# Patient Record
Sex: Male | Born: 1985 | Race: Black or African American | Hispanic: No | Marital: Single | Smoking: Current some day smoker
Health system: Southern US, Community
[De-identification: ages and names within clinical notes are randomized; demographics above are authoritative.]

## PROBLEM LIST (undated history)

## (undated) DIAGNOSIS — F329 Major depressive disorder, single episode, unspecified: Secondary | ICD-10-CM

## (undated) DIAGNOSIS — F419 Anxiety disorder, unspecified: Secondary | ICD-10-CM

## (undated) DIAGNOSIS — F32A Depression, unspecified: Secondary | ICD-10-CM

## (undated) DIAGNOSIS — I509 Heart failure, unspecified: Secondary | ICD-10-CM

---
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
## 2019-10-16 ENCOUNTER — Other Ambulatory Visit: Payer: Self-pay

## 2019-10-16 ENCOUNTER — Ambulatory Visit (HOSPITAL_COMMUNITY)
Admission: EM | Admit: 2019-10-16 | Discharge: 2019-10-16 | Disposition: A | Discharge status: Home / Self Care | Payer: Self-pay

## 2019-10-16 ENCOUNTER — Encounter (HOSPITAL_COMMUNITY): Payer: Self-pay

## 2019-10-16 DIAGNOSIS — T148XXA Other injury of unspecified body region, initial encounter: Secondary | ICD-10-CM

## 2019-10-16 HISTORY — DX: Depression, unspecified: F32.A

## 2019-10-16 HISTORY — DX: Anxiety disorder, unspecified: F41.9

## 2019-10-16 NOTE — ED Triage Notes (Signed)
Pt states he got glass in his right upper leg a few years ago, but they never got all of the glass out, because it was too deep and for the past 3 days he's been having pain in the leg. Pt c/o 8/10 throbbing right upper leg pain. Pt states he vomited once today. Pt denies diarrhea. Pt has some swelling in a small area of the right outer thigh.

## 2019-10-16 NOTE — Discharge Instructions (Signed)
Keep doing the compression with ace wrap to the upper leg Rest, ice and elevate.  Follow up as needed for continued or worsening symptoms

## 2019-10-17 NOTE — ED Provider Notes (Signed)
[facility]    CSN: [id number] Arrival date & time: 10/16/19  1446      History   Chief Complaint Chief Complaint  Patient presents with  . right leg pain    HPI Tyrone White is a 34 y.o. male.   Patient is a 34 year old male that presents today with right posterior thigh pain.  This has been constant over the past 3 days.  Pain with walking and range of motion.  History of previous injury to that leg with glass involvement.  Reporting still has piece of glass in the back of his leg.  Denies any redness or drainage from the areas.  Describes the sensation as pulling in the back of his leg.  Denies any specific injuries.  Mild swelling to the back of the leg.  No numbness, tingling, weakness or radiation of pain.  ROS per HPI      Past Medical History:  Diagnosis Date  . Anxiety   . Depression     There are no problems to display for this patient.   History reviewed. No pertinent surgical history.     Home Medications    Prior to Admission medications   Not on File    Family History History reviewed. No pertinent family history.  Social History Social History   Tobacco Use  . Smoking status: Former Research scientist (life sciences)  . Smokeless tobacco: Never Used  Substance Use Topics  . Alcohol use: Never  . Drug use: Never     Allergies   Patient has no known allergies.   Review of Systems Review of Systems   Physical Exam Triage Vital Signs ED Triage Vitals  Enc Vitals Group     BP 10/16/19 1503 (!) 147/88     Pulse Rate 10/16/19 1503 92     Resp 10/16/19 1503 16     Temp 10/16/19 1503 98.7 F (37.1 C)     Temp Source 10/16/19 1503 Oral     SpO2 10/16/19 1503 99 %     Weight 10/16/19 1508 160 lb (72.6 kg)     Height 10/16/19 1508 6' (1.829 m)     Head Circumference --      Peak Flow --      Pain Score 10/16/19 1508 8     Pain Loc --      Pain Edu? --      Excl. in [place]? --    No data found.  Updated Vital Signs BP (!) 147/88 (BP  Location: Left Arm)   Pulse 92   Temp 98.7 F (37.1 C) (Oral)   Resp 16   Ht 6' (1.829 m)   Wt 160 lb (72.6 kg)   SpO2 99%   BMI 21.70 kg/m   Visual Acuity Right Eye Distance:   Left Eye Distance:   Bilateral Distance:    Right Eye Near:   Left Eye Near:    Bilateral Near:     Physical Exam Vitals and nursing note reviewed.  Constitutional:      Appearance: Normal appearance.  HENT:     Head: Normocephalic and atraumatic.     Nose: Nose normal.  Eyes:     Conjunctiva/sclera: Conjunctivae normal.  Pulmonary:     Effort: Pulmonary effort is normal.  Musculoskeletal:        General: Normal range of motion.     Cervical back: Normal range of motion.       Legs:     Comments: TTP with mild swelling.  Some indention and scarring from previous injury. No bruising.  Normal ROM   Skin:    General: Skin is warm and dry.  Neurological:     Mental Status: He is alert.  Psychiatric:        Mood and Affect: Mood normal.      UC Treatments / Results  Labs (all labs ordered are listed, but only abnormal results are displayed) Labs Reviewed - No data to display  EKG   Radiology No results found.  Procedures Procedures (including critical care time)  Medications Ordered in UC Medications - No data to display  Initial Impression / Assessment and Plan / UC Course  I have reviewed the triage vital signs and the nursing notes.  Pertinent labs & imaging results that were available during my care of the patient were reviewed by me and considered in my medical decision making (see chart for details).     Muscle pain- most likely hamstring strain.  Treating with ace wrap, ice.  NSAIDs Follow up as needed for continued or worsening symptoms  Final Clinical Impressions(s) / UC Diagnoses   Final diagnoses:  Muscle strain     Discharge Instructions     Keep doing the compression with ace wrap to the upper leg Rest, ice and elevate.  Follow up as needed for  continued or worsening symptoms     ED Prescriptions    None     PDMP not reviewed this encounter.   Janace Aris, NP 10/17/19 [phone]

## 2020-01-12 ENCOUNTER — Other Ambulatory Visit: Payer: Self-pay

## 2020-01-12 ENCOUNTER — Ambulatory Visit (INDEPENDENT_AMBULATORY_CARE_PROVIDER_SITE_OTHER): Payer: No Payment, Other | Admitting: Licensed Clinical Social Worker

## 2020-01-12 DIAGNOSIS — F411 Generalized anxiety disorder: Secondary | ICD-10-CM | POA: Diagnosis not present

## 2020-01-12 NOTE — Progress Notes (Signed)
Comprehensive Clinical Assessment (CCA) Note  01/12/2020 Tyrone White [id number]  Visit Diagnosis:      ICD-10-CM   1. Generalized anxiety disorder  F41.1     CCA Screening, Triage and Referral (STR) Patient Reported Information How did you hear about [place]? Other (Comment) ([facility], Tyrone White)  What Is the Reason for Your Visit/Call Today? Coping with anx and additction  How Long Has This Been Causing You Problems? > than 6 months  What Do You Feel Would Help You the Most Today? Therapy;Medication  Have You Recently Been in Any Inpatient Treatment (Hospital/Detox/Crisis Center/28-Day Program)? No  Have You Ever Received Services From [facility] Before? Yes  Who Do You See at [facility]? Urgent Care  Have You Recently Had Any Thoughts About Hurting Yourself? No  Are You Planning to Commit Suicide/Harm Yourself At This time? No  Have you Recently Had Thoughts About Hurting Someone Tyrone White? No  Have You Used Any Alcohol or Drugs in the Past 24 Hours? No  How Long Ago Did You Use Drugs or Alcohol? No data recorded What Did You Use and How Much? No data recorded  Do You Currently Have a Therapist/Psychiatrist? No  Have You Been Recently Discharged From Any Office Practice or Programs? No   CCA Screening Triage Referral Assessment Type of Contact: Face-to-Face  Is this Initial or Reassessment? Initial Assessment  Collateral Involvement: Tyrone White  Patient Determined To Be At Risk for Harm To Self or Others Based on Review of Patient Reported Information or Presenting Complaint? No  Location of Assessment: [facility]  Does Patient Present under Involuntary Commitment? No  [place] of Residence: [place]  Patient Currently Receiving the Following Services: Medication Management;Group Home  Options For Referral: Outpatient Therapy;Medication Management  CCA Biopsychosocial Intake/Chief Complaint:  CCA Intake With Chief Complaint CCA Part Two  Date: 01/12/20 CCA Part Two Time: 0200 Chief Complaint/Presenting Problem: Problem with anxiety and adequate coping without use of substances. Patient's Currently Reported Symptoms/Problems: mparanoia, always feeling like people are talking about him, poor focus, poor sleep, poor self-esteem Individual's Strengths: Receptive to help, help seeking Individual's Preferences: Wants to be called "Tyrone White". Type of Services Patient Feels Are Needed: Couseling, has appt for med management Initial Clinical Notes/Concerns: LCSW reviewed informed consent with pt verbal statement of understanding. Pt reports he is living at [facility] where he is not totally comforable but plans to remain for now. He was homeless ~ 2 mon. prior to that and living with an Aunt prior to that.  He feels he is disliked at [facility] and people are talking about him. Reports hearing voices at times that sound like people talking about him. 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 He denies any intent for self harm and states he does not hear any command voices. Believes he first heard voices at ~ age of 89. Pt lost his mother in 2016 to heart disease. He does not feel he got the support he needed to process loss at that time. Pt would like help developing coping skills to address feelings of anxiety and paranoia so he does not use substances to cope. Pt considering applying for disability.  Mental Health Symptoms Depression:  Depression: None  Mania:  Mania: None  Anxiety:   Anxiety: Difficulty concentrating, Irritability, Restlessness, Worrying, Tension, Sleep  Psychosis:  Psychosis: Duration of symptoms greater than six months, Hallucinations (auditory)  Trauma:  Trauma: None  Obsessions:  Obsessions: Recurrent & persistent thoughts/impulses/images  Compulsions:     Inattention:  Inattention:  (Reports poor focus)  Hyperactivity/Impulsivity:  Hyperactivity/Impulsivity:  Feeling of restlessness  Oppositional/Defiant Behaviors:  Oppositional/Defiant Behaviors: None  Emotional Irregularity:  Emotional Irregularity: Transient, stress-related paranoia/disassociation, Unstable self-image  Other Mood/Personality Symptoms:      Mental Status Exam Appearance and self-care  Stature:  Stature: Tall  Weight:  Weight: Thin  Clothing:  Clothing: Casual  Grooming:  Grooming: Normal  Cosmetic use:  Cosmetic Use: None  Posture/gait:  Posture/Gait: Tense  Motor activity:  Motor Activity: Restless  Sensorium  Attention:     Concentration:  Concentration:  (Reports poor focus)  Orientation:  Orientation: X5  Recall/memory:  Recall/Memory: Normal  Affect and Mood  Affect:     Mood:  Mood: Anxious  Relating  Eye contact:  Eye Contact: Staring  Facial expression:  Facial Expression: Anxious  Attitude toward examiner:  Attitude Toward Examiner: Cooperative  Thought and Language  Speech flow: Speech Flow: Normal  Thought content:  Thought Content: Appropriate to Mood and Circumstances  Preoccupation:  Preoccupations: Ruminations  Hallucinations:  Hallucinations: Auditory  Organization:     Company secretary of Knowledge:  Fund of Knowledge: Average  Intelligence:  Intelligence: Average  Abstraction:  Abstraction: Normal  Judgement:  Judgement:  (Additional assessment needed)  Reality Testing:  Reality Testing: Adequate  Insight:  Insight: Gaps  Decision Making:  Decision Making: Impulsive  Social Functioning  Social Maturity:  Social Maturity: Impulsive  Social Judgement:  Social Judgement: "Chief of Staff"  Stress  Stressors:  Stressors: Veterinary surgeon, Housing, Doctor, hospital Ability:  Coping Ability: Deficient supports  Skill Deficits:  Skill Deficits: Self-control  Supports:  Supports: Water engineer, Support needed   Religion: Religion/Spirituality Are You A Religious Person?: Yes What is Your Religious Affiliation?:  Chiropodist: Leisure / Recreation Do You Have Hobbies?: Yes Leisure and Hobbies: Reading  Exercise/Diet: Exercise/Diet Do You Exercise?: No Do You Have Any Trouble Sleeping?: Yes Explanation of Sleeping Difficulties: Trouble staying asleep  CCA Employment/Education Employment/Work Situation: Employment / Work Situation Employment situation: Unemployed Patient's job has been impacted by current illness: Yes Describe how patient's job has been impacted: Not able to work What is the longest time patient has a held a job?: 2 yrs Where was the patient employed at that time?: [place] Has patient ever been in the [employer]?: No  Education: Education Is Patient Currently Attending School?: No Last Grade Completed: 11 (Got GED) Did Garment/textile technologist From [employer]?: No Did You Product manager?: Yes What Type of 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?: Ship broker Did Designer, television/film set?: No  CCA Family/Childhood History Family and Relationship History: Family history Marital status: Single What is your sexual orientation?: Heterosexual Does patient have children?: No  Childhood History:  Childhood History By whom was/is the patient raised?: Mother Description of patient's relationship with caregiver when they were a child: Close Patient's description of current relationship with people who raised him/her: Mother died of heart disease in 11-24-2014 How were you disciplined when you got in trouble as a child/adolescent?: spankings Does patient have siblings?: Yes Number of Siblings: 5 Description of patient's current relationship with siblings: No contact Did patient suffer any verbal/emotional/physical/sexual abuse as a child?: No Did patient suffer from severe childhood neglect?: No Has patient ever been sexually abused/assaulted/raped as an adolescent or adult?: No Was the patient ever a victim of a crime or a disaster?: No Witnessed domestic violence?:  No  Has patient been affected by domestic violence as an adult?: No  CCA Substance Use Alcohol/Drug Use: Alcohol / Drug Use Pain Medications: See chart Prescriptions: See chart Over the Counter: See chart History of alcohol / drug use?: Yes Longest period of sobriety (when/how long): 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 He did however get into an argument last Sat with a White mate and ended up driniking 2 beers and smoking cannabis. Denies any inpt stays for substance abuse. Negative Consequences of Use: Personal relationships, Financial, Legal  Hermine Messick

## 2020-01-21 ENCOUNTER — Encounter (HOSPITAL_COMMUNITY): Payer: Self-pay

## 2020-01-21 ENCOUNTER — Other Ambulatory Visit: Payer: Self-pay

## 2020-01-21 ENCOUNTER — Ambulatory Visit (HOSPITAL_COMMUNITY)
Admission: EM | Admit: 2020-01-21 | Discharge: 2020-01-21 | Disposition: A | Discharge status: Home / Self Care | Payer: Self-pay | Attending: Family Medicine | Admitting: Family Medicine

## 2020-01-21 ENCOUNTER — Ambulatory Visit (INDEPENDENT_AMBULATORY_CARE_PROVIDER_SITE_OTHER): Payer: Self-pay

## 2020-01-21 DIAGNOSIS — R1013 Epigastric pain: Secondary | ICD-10-CM

## 2020-01-21 DIAGNOSIS — R1032 Left lower quadrant pain: Secondary | ICD-10-CM

## 2020-01-21 DIAGNOSIS — R109 Unspecified abdominal pain: Secondary | ICD-10-CM

## 2020-01-21 DIAGNOSIS — G8929 Other chronic pain: Secondary | ICD-10-CM

## 2020-01-21 LAB — POCT URINALYSIS DIP (DEVICE)
Bilirubin Urine: NEGATIVE
Glucose, UA: NEGATIVE mg/dL
Hgb urine dipstick: NEGATIVE
Ketones, ur: NEGATIVE mg/dL
Leukocytes,Ua: NEGATIVE
Nitrite: NEGATIVE
Protein, ur: NEGATIVE mg/dL
Specific Gravity, Urine: 1.01 (ref 1.005–1.030)
Urobilinogen, UA: 0.2 mg/dL (ref 0.0–1.0)
pH: 7 (ref 5.0–8.0)

## 2020-01-21 NOTE — ED Provider Notes (Signed)
[facility]    CSN: [id number] Arrival date & time: 01/21/20  1228      History   Chief Complaint Chief Complaint  Patient presents with  . Abdominal Pain    HPI Tyrone White is a 34 y.o. male. who presents with LLQ pain described as pressure off and on for > 1y and radiates to his L groin region. This is provoked after eating or drinking anything off and on. Denies diarrhea, constipation, N/V or blood in stool associated to this. Has not seen his PCP for this. Has apt with her 7/8    Past Medical History:  Diagnosis Date  . Anxiety   . CHF (congestive heart failure) (HCC)   . Depression     There are no problems to display for this patient.   History reviewed. No pertinent surgical history.     Home Medications    Prior to Admission medications   Not on File    Family History History reviewed. No pertinent family history.  Social History Social History   Tobacco Use  . Smoking status: Former Games developer  . Smokeless tobacco: Never Used  Vaping Use  . Vaping Use: Never used  Substance Use Topics  . Alcohol use: Never  . Drug use: Never     Allergies   Patient has no known allergies.   Review of Systems Review of Systems  Constitutional: Negative for activity change, appetite change, chills, diaphoresis, fever and unexpected weight change.  Gastrointestinal: Positive for abdominal pain. Negative for abdominal distention, blood in stool, constipation, diarrhea, nausea and vomiting.  Genitourinary: Negative for difficulty urinating and scrotal swelling.  Musculoskeletal: Negative for gait problem.  Skin: Negative for rash.  Hematological: Negative for adenopathy.     Physical Exam Triage Vital Signs ED Triage Vitals  Enc Vitals Group     BP 01/21/20 1317 (!) 137/92     Pulse Rate 01/21/20 1317 64     Resp 01/21/20 1317 18     Temp 01/21/20 1317 98.4 F (36.9 C)     Temp Source 01/21/20 1317 Oral     SpO2 01/21/20 1317 100 %       Weight --      Height --      Head Circumference --      Peak Flow --      Pain Score 01/21/20 1316 8     Pain Loc --      Pain Edu? --      Excl. in [place]? --    No data found.  Updated Vital Signs BP (!) 137/92 (BP Location: Right Arm)   Pulse 64   Temp 98.4 F (36.9 C) (Oral)   Resp 18   SpO2 100%   Visual Acuity Right Eye Distance:   Left Eye Distance:   Bilateral Distance:    Right Eye Near:   Left Eye Near:    Bilateral Near:     Physical Exam Vitals and nursing note reviewed.  Constitutional:      General: He is not in acute distress.    Appearance: He is normal weight. He is not toxic-appearing.  HENT:     Head: Atraumatic.  Eyes:     General: No scleral icterus.    Extraocular Movements: Extraocular movements intact.  Pulmonary:     Effort: Pulmonary effort is normal.  Abdominal:     General: Abdomen is flat. Bowel sounds are normal. There is no distension. There are no signs of injury.  Palpations: Abdomen is soft. There is no hepatomegaly, splenomegaly, mass or pulsatile mass.     Tenderness: There is no abdominal tenderness.     Hernia: There is no hernia in the umbilical area or left inguinal area.  Skin:    General: Skin is warm and dry.  Neurological:     Mental Status: He is alert and oriented to person, place, and time.  Psychiatric:        Mood and Affect: Mood normal.        Behavior: Behavior normal.      UC Treatments / Results  Labs (all labs ordered are listed, but only abnormal results are displayed) Labs Reviewed  POCT URINALYSIS DIP (DEVICE)    EKG   Radiology DG Abd 2 Views  Result Date: 01/21/2020 CLINICAL DATA:  Left lower abdominal pain. EXAM: ABDOMEN - 2 VIEW COMPARISON:  None. FINDINGS: The bowel gas pattern is normal. There is no evidence of free air. No radio-opaque calculi or other significant radiographic abnormality is seen. IMPRESSION: Negative. Electronically Signed   By: Katherine Mantle M.D.   On:  01/21/2020 15:10    Procedures Procedures (including critical care time)  Medications Ordered in UC Medications - No data to display  Initial Impression / Assessment and Plan / UC Course  I have reviewed the triage vital signs and the nursing notes. Pertinent  imaging results that were available during my care of the patient were reviewed by me and considered in my medical decision making (see chart for details). Encouraged strongly to not miss his apt with PCP next week so he can be sent to a specialist and or do more tests.     Final Clinical Impressions(s) / UC Diagnoses   Final diagnoses:  Abdominal pain, chronic, epigastric     Discharge Instructions     Because you have medicaid you have to be referred by your primary care provider to a specialist or have more test done. Please call them today to make an appointment.  Your abdominal xray today is normal.     ED Prescriptions    None     PDMP not reviewed this encounter.   Garey Ham, [place] 01/21/20 1530

## 2020-01-21 NOTE — ED Triage Notes (Signed)
Pt presents with left lower quadrant pain on and off x 1 years. Pain is worse after eating.

## 2020-01-21 NOTE — Discharge Instructions (Addendum)
Because you have medicaid you have to be referred by your primary care provider to a specialist or have more test done. Please call them today to make an appointment.  Your abdominal xray today is normal.

## 2020-01-29 ENCOUNTER — Ambulatory Visit (INDEPENDENT_AMBULATORY_CARE_PROVIDER_SITE_OTHER): Payer: No Payment, Other | Admitting: Psychiatry

## 2020-01-29 ENCOUNTER — Ambulatory Visit (HOSPITAL_COMMUNITY): Payer: No Payment, Other | Admitting: Licensed Clinical Social Worker

## 2020-01-29 ENCOUNTER — Encounter (HOSPITAL_COMMUNITY): Payer: Self-pay | Admitting: Psychiatry

## 2020-01-29 ENCOUNTER — Other Ambulatory Visit: Payer: Self-pay

## 2020-01-29 DIAGNOSIS — F411 Generalized anxiety disorder: Secondary | ICD-10-CM

## 2020-01-29 DIAGNOSIS — F330 Major depressive disorder, recurrent, mild: Secondary | ICD-10-CM

## 2020-01-29 DIAGNOSIS — R1032 Left lower quadrant pain: Secondary | ICD-10-CM

## 2020-01-29 MED ORDER — SERTRALINE HCL 25 MG PO TABS: 25.0000 mg | ORAL_TABLET | Freq: Every day | ORAL | 1 refills | Status: DC

## 2020-01-29 MED ORDER — QUETIAPINE FUMARATE 300 MG PO TABS: 300.0000 mg | ORAL_TABLET | Freq: Every day | ORAL | 1 refills | Status: DC

## 2020-01-29 MED ORDER — HYDROXYZINE HCL 25 MG PO TABS: 25.0000 mg | ORAL_TABLET | Freq: Three times a day (TID) | ORAL | 1 refills | Status: DC | PRN

## 2020-01-29 MED ORDER — TRAZODONE HCL 50 MG PO TABS: 50.0000 mg | ORAL_TABLET | Freq: Every day | ORAL | 1 refills | Status: DC
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
## 2020-02-19 ENCOUNTER — Other Ambulatory Visit: Payer: Self-pay

## 2020-02-19 ENCOUNTER — Ambulatory Visit (INDEPENDENT_AMBULATORY_CARE_PROVIDER_SITE_OTHER): Payer: No Payment, Other | Admitting: Licensed Clinical Social Worker

## 2020-02-19 DIAGNOSIS — F411 Generalized anxiety disorder: Secondary | ICD-10-CM

## 2020-02-20 NOTE — Progress Notes (Signed)
   THERAPIST PROGRESS NOTE  Session Time: 45 min  Participation Level: Active  Behavioral Response: CasualAlertMildy Anxious  Type of Therapy: Individual Therapy  Treatment Goals addressed: Coping  Interventions: Supportive and Other: Additional assessment  Summary: Tyrone White is a 34 y.o. male who presents with hx of GAD/substance abuse. Pt reports he is doing "good". LCSW assessed for outcome of med management appt from his perspective. He states it went well, some adjustments made. Pt believes he is feeling better. He states "I feel more energized". He reports paranoia can come up but feels it is improved as well. LCSW assessed for outcome of pt's court date 7/19 he was worried about getting to in his home town. His aunt and uncle did come to pick him up and bring him back. He states it was good to spend time with them and was pleased they could see he is doing well. Court case was dismissed. [name] says it could get picked up in [employer] but he does not know if that will happen. He advises there is a limited time frame for [employer] to take action. Pt makes positive statements about wanting to do the right thing and have responsible goals. He has not used any substances with the exception of cannabis. Pt has his driving test 2/45 to get license. He states a Designer, television/film set member is going to allow him to use his car to take the driving test. Addressed coping. Pt endorses deep breathing. He forgot about self guided imagery, which was reviewed. Pt reports he wrote a letter to [employer] about his life, which he found helpful. He states "I want to tell my story". LCSW provided education and literature on self talk. Pt agrees to pay attention to self talk and journal self talk between now and next session. LCSW reviewed poc with pt's verbal agreement prior to close of session. Pt states appreciation for care.     Suicidal/Homicidal: Nowithout intent/plan  Therapist Response: Pt open  and receptive to care.  Plan: Return again in 2 weeks.  Diagnosis: Axis I: Generalized Anxiety Disorder    Axis II: Deferred  Paris Sink, LCSW 02/20/2020

## 2020-02-22 DIAGNOSIS — M2392 Unspecified internal derangement of left knee: Secondary | ICD-10-CM

## 2020-02-22 HISTORY — DX: Unspecified internal derangement of left knee: M23.92

## 2020-03-02 ENCOUNTER — Ambulatory Visit (INDEPENDENT_AMBULATORY_CARE_PROVIDER_SITE_OTHER): Payer: Self-pay | Admitting: Family Medicine

## 2020-03-02 ENCOUNTER — Other Ambulatory Visit: Payer: Self-pay

## 2020-03-02 ENCOUNTER — Encounter: Payer: Self-pay | Admitting: Family Medicine

## 2020-03-02 VITALS — BP 134/82 | HR 70 | Temp 97.5°F | Resp 16 | Ht 73.0 in | Wt 180.8 lb

## 2020-03-02 DIAGNOSIS — R1084 Generalized abdominal pain: Secondary | ICD-10-CM

## 2020-03-02 DIAGNOSIS — F411 Generalized anxiety disorder: Secondary | ICD-10-CM

## 2020-03-02 DIAGNOSIS — R011 Cardiac murmur, unspecified: Secondary | ICD-10-CM

## 2020-03-02 DIAGNOSIS — Z09 Encounter for follow-up examination after completed treatment for conditions other than malignant neoplasm: Secondary | ICD-10-CM

## 2020-03-02 DIAGNOSIS — R809 Proteinuria, unspecified: Secondary | ICD-10-CM

## 2020-03-02 DIAGNOSIS — R1032 Left lower quadrant pain: Secondary | ICD-10-CM

## 2020-03-02 DIAGNOSIS — F4321 Adjustment disorder with depressed mood: Secondary | ICD-10-CM

## 2020-03-02 DIAGNOSIS — G8929 Other chronic pain: Secondary | ICD-10-CM

## 2020-03-02 DIAGNOSIS — Z7689 Persons encountering health services in other specified circumstances: Secondary | ICD-10-CM

## 2020-03-02 LAB — GLUCOSE, POCT (MANUAL RESULT ENTRY): POC Glucose: 94 mg/dl (ref 70–99)

## 2020-03-02 LAB — POCT URINALYSIS DIPSTICK
Bilirubin, UA: NEGATIVE
Blood, UA: NEGATIVE
Glucose, UA: NEGATIVE
Ketones, UA: NEGATIVE
Leukocytes, UA: NEGATIVE
Nitrite, UA: NEGATIVE
Protein, UA: NEGATIVE
Spec Grav, UA: <=1.005 — AB (ref 1.010–1.025)
Urobilinogen, UA: 0.2 E.U./dL
pH, UA: 7 (ref 5.0–8.0)

## 2020-03-02 LAB — POCT GLYCOSYLATED HEMOGLOBIN (HGB A1C)
HbA1c POC (<> result, manual entry): 5.7 % (ref 4.0–5.6)
HbA1c, POC (controlled diabetic range): 5.7 % (ref 0.0–7.0)
HbA1c, POC (prediabetic range): 5.7 % (ref 5.7–6.4)
Hemoglobin A1C: 5.7 % — AB (ref 4.0–5.6)

## 2020-03-02 MED ORDER — IBUPROFEN 800 MG PO TABS
800.0000 mg | ORAL_TABLET | Freq: Three times a day (TID) | ORAL | 3 refills | Status: DC | PRN
Start: 2020-03-02 — End: 2020-07-02

## 2020-03-02 NOTE — Progress Notes (Signed)
 [facility]    New Patient--Hospital Follow--Establish Care  Subjective:  Patient ID: Tyrone White, male    DOB: 08/04/1985  Age: 34 y.o. MRN: [id number]  CC:  Chief Complaint  Patient presents with  . Establish Care  . Abdominal Pain    Pt states pain is off and on.    HPI Tyrone White is a 34 year old male who  presents for Follow Up today.    Patient Active Problem List   Diagnosis Date Noted  . Generalized anxiety disorder 01/29/2020  . 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 Left lower quadrant abdominal pain 01/29/2020    Past Medical History:  Diagnosis Date  . Anxiety   . Depression    Current Status: This will be TyroneWhite's initial office visit with me. He was not previously seeing a Physician for her PCP needs. Since his last office visit, he reports generalized and lower abdominal quadrant pain X a few years now, with nausea, vomiting, and intense abdominal pain. DG abdominal scan 12/2019 was negative for any abnormalities. He denies fevers, chills, fatigue, recent infections, weight loss, and night sweats. He has not had any headaches, visual changes, dizziness, and falls. No chest pain, heart palpitations, cough and shortness of breath reported. No reports of GI problems such as nausea, vomiting, diarrhea, and constipation. He has no reports of blood in stools, dysuria and hematuria. No depression or anxiety, and denies suicidal ideations, homicidal ideations, or auditory hallucinations. He is taking all medications as prescribed. He denies pain today.   History reviewed. No pertinent surgical history.  Family History  Problem Relation Age of Onset  . Heart disease Mother   . Hypertension Mother   . Diabetes Maternal Uncle   . Prostate cancer Father     Social History   Socioeconomic History  . Marital status: Single    Spouse name: Not on file  . Number  of children: Not on file  . Years of education: Not on file  . Highest education level: Not on file  Occupational History  . Not on file  Tobacco Use  . Smoking status: Former Smoker    Types: Cigarettes  . Smokeless tobacco: Never Used  . Tobacco comment: "state that he smokes 3 cigarettes a day"  Vaping Use  . Vaping Use: Never used  Substance and Sexual Activity  . Alcohol use: Not Currently  . Drug use: Not Currently  . Sexual activity: Not Currently  Other Topics Concern  . Not on file  Social History Narrative  . Not on file   Social Determinants of Health   Financial Resource Strain:   . Difficulty of Paying Living Expenses:   Food Insecurity:   . Worried About Running Out of Food in the Last Year:   . Ran Out of Food in the Last Year:   Transportation Needs:   . Lack of Transportation (Medical):   . Lack of Transportation (Non-Medical):   Physical Activity:   . Days of Exercise per Week:   . Minutes of Exercise per Session:   Stress:   . Feeling of Stress :   Social Connections:   . Frequency of Communication with Friends and Family:   . Frequency of Social Gatherings with Friends and Family:   . Attends Religious Services:   . Active Member of Clubs or Organizations:   . Attends Club or Organization Meetings:   . Marital Status:     Intimate Partner Violence:   . Fear of Current or Ex-Partner:   . Emotionally Abused:   Marland Kitchen Physically Abused:   . Sexually Abused:     Outpatient Medications Prior to Visit  Medication Sig Dispense Refill  . hydrOXYzine (ATARAX/VISTARIL) 25 MG tablet Take 1 tablet (25 mg total) by mouth 3 (three) times daily as needed. 115 tablet 1  . QUEtiapine (SEROQUEL) 300 MG tablet Take 1 tablet (300 mg total) by mouth at bedtime. 30 tablet 1  . sertraline (ZOLOFT) 25 MG tablet Take 1 tablet (25 mg total) by mouth daily. 30 tablet 1  . traZODone (DESYREL) 50 MG tablet Take 1 tablet (50 mg total) by mouth at bedtime. 30 tablet 1   No  facility-administered medications prior to visit.    No Known Allergies  ROS Review of Systems  Constitutional: Negative.   HENT: Negative.   Eyes: Negative.   Respiratory: Negative.   Cardiovascular: Negative.   Gastrointestinal: Positive for abdominal pain (frequent generalized; left lower quadrant. ) and nausea (occasional ).  Endocrine: Negative.   Genitourinary: Negative.   Allergic/Immunologic: Negative.   Neurological: Negative.   Hematological: Negative.   Psychiatric/Behavioral: Negative.       Objective:    Physical Exam Vitals and nursing note reviewed.  Constitutional:      Appearance: Normal appearance. He is well-developed.  HENT:     Head: Normocephalic and atraumatic.     Nose: Nose normal.     Mouth/Throat:     Mouth: Mucous membranes are moist.     Pharynx: Oropharynx is clear.  Cardiovascular:     Rate and Rhythm: Normal rate and regular rhythm.     Pulses: Normal pulses.     Heart sounds: Murmur heard.   Pulmonary:     Effort: Pulmonary effort is normal.     Breath sounds: Normal breath sounds.  Abdominal:     General: Abdomen is flat. Bowel sounds are normal.  Musculoskeletal:        General: Normal range of motion.     Cervical back: Normal range of motion and neck supple.  Skin:    General: Skin is warm and dry.     Capillary Refill: Capillary refill takes less than 2 seconds.  Neurological:     General: No focal deficit present.     Mental Status: He is alert and oriented to person, place, and time.  Psychiatric:        Mood and Affect: Mood normal.        Behavior: Behavior normal.        Thought Content: Thought content normal.        Judgment: Judgment normal.     BP 134/82 (BP Location: Left Arm, Patient Position: Sitting, Cuff Size: Normal)   Pulse 70   Temp (!) 97.5 F (36.4 C)   Resp 16   Ht 6' 1" (1.854 m)   Wt 180 lb 12.8 oz (82 kg)   SpO2 100%   BMI 23.85 kg/m  Wt Readings from Last 3 Encounters:  03/02/20 180  lb 12.8 oz (82 kg)  10/16/19 160 lb (72.6 kg)     Health Maintenance Due  Topic Date Due  . Hepatitis C Screening  Never done  . COVID-19 Vaccine (1) Never done  . HIV Screening  Never done  . TETANUS/TDAP  Never done  . INFLUENZA VACCINE  02/22/2020    There are no preventive care reminders to display for this patient.  No results  found for: TSH No results found for: WBC, HGB, HCT, MCV, PLT No results found for: NA, K, CHLORIDE, CO2, GLUCOSE, BUN, CREATININE, BILITOT, ALKPHOS, AST, ALT, PROT, ALBUMIN, CALCIUM, ANIONGAP, EGFR, GFR No results found for: CHOL No results found for: HDL No results found for: LDLCALC No results found for: TRIG No results found for: CHOLHDL No results found for: HGBA1C    Assessment & Plan:   1. Hospital discharge follow-up  2. Encounter to establish care - POC HgB A1c - POC Glucose (CBG) - Urinalysis Dipstick  3. Chronic generalized abdominal pain  4. Proteinuria, unspecified type  5. Generalized anxiety disorder  6. Left lower quadrant abdominal pain - CT ABDOMEN PELVIS WO CONTRAST; Future  7. Grieving Continues to grieve over the death of his mother in 2016.   8. Heart murmur Stable.   9. Follow up He will follow up in 3 months.   Meds ordered this encounter  Medications  . ibuprofen (ADVIL) 800 MG tablet    Sig: Take 1 tablet (800 mg total) by mouth every 8 (eight) hours as needed.    Dispense:  30 tablet    Refill:  3    Orders Placed This Encounter  Procedures  . CT ABDOMEN PELVIS WO CONTRAST  . POC HgB A1c  . POC Glucose (CBG)  . Urinalysis Dipstick    Referral Orders  No referral(s) requested today     ,  MSN, FNP-BC [facility] [address]  [place], [place] [address] [phone] fax  Problem List Items Addressed This Visit      Other   Generalized anxiety disorder   Left lower  quadrant abdominal pain   Relevant Orders   CT ABDOMEN PELVIS WO CONTRAST    Other Visit Diagnoses    Hospital discharge follow-up    -  Primary   Encounter to establish care       Relevant Orders   POC HgB A1c   POC Glucose (CBG)   Urinalysis Dipstick   Chronic generalized abdominal pain       Relevant Medications   ibuprofen (ADVIL) 800 MG tablet   Proteinuria, unspecified type       Grieving       Heart murmur       Follow up          Meds ordered this encounter  Medications  . ibuprofen (ADVIL) 800 MG tablet    Sig: Take 1 tablet (800 mg total) by mouth every 8 (eight) hours as needed.    Dispense:  30 tablet    Refill:  3    Follow-up: Return in about 3 months (around 06/02/2020).     M , FNP 

## 2020-03-04 ENCOUNTER — Other Ambulatory Visit: Payer: Self-pay

## 2020-03-04 ENCOUNTER — Ambulatory Visit (INDEPENDENT_AMBULATORY_CARE_PROVIDER_SITE_OTHER): Payer: No Payment, Other | Admitting: Licensed Clinical Social Worker

## 2020-03-04 ENCOUNTER — Encounter: Payer: Self-pay | Admitting: Family Medicine

## 2020-03-04 DIAGNOSIS — F411 Generalized anxiety disorder: Secondary | ICD-10-CM | POA: Diagnosis not present
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
## 2020-03-10 ENCOUNTER — Ambulatory Visit (INDEPENDENT_AMBULATORY_CARE_PROVIDER_SITE_OTHER): Payer: No Payment, Other | Admitting: Psychiatry

## 2020-03-10 ENCOUNTER — Other Ambulatory Visit: Payer: Self-pay

## 2020-03-10 DIAGNOSIS — F330 Major depressive disorder, recurrent, mild: Secondary | ICD-10-CM | POA: Diagnosis not present

## 2020-03-10 DIAGNOSIS — F411 Generalized anxiety disorder: Secondary | ICD-10-CM | POA: Diagnosis not present

## 2020-03-10 MED ORDER — HYDROXYZINE HCL 25 MG PO TABS: 25.0000 mg | ORAL_TABLET | Freq: Three times a day (TID) | ORAL | 1 refills | Status: DC | PRN

## 2020-03-10 MED ORDER — TRAZODONE HCL 50 MG PO TABS: 50.0000 mg | ORAL_TABLET | Freq: Every day | ORAL | 1 refills | Status: DC

## 2020-03-10 MED ORDER — QUETIAPINE FUMARATE 200 MG PO TABS: 200.0000 mg | ORAL_TABLET | Freq: Every day | ORAL | 2 refills | Status: DC

## 2020-03-10 MED ORDER — SERTRALINE HCL 25 MG PO TABS: 25.0000 mg | ORAL_TABLET | Freq: Every day | ORAL | 1 refills | Status: DC

## 2020-03-10 NOTE — Progress Notes (Signed)
BH MD/PA/NP OP Progress Note  03/10/2020 6:06 PM Tyrone White  MRN:  [id number]  Chief Complaint: "I have very little anxiety and depression"  HPI: 34 year old male seen today for follow up psychiatric evaluation.   He has a psychiatric history of anxiety, depression, marijuana use disorder, and methamphetamine use disorder.  Patient is currently being managed on Zoloft 25 mg daily,  hydroxyzine 25 mg three times a day, Seroquel 300 mg at bedtime, and trazodone 50 mg at bedtime.    He reports that his current regimen is effective in managing his psychiatric conditions.  Today he notes that his depression and anxiety has improved.  He rates his depression as 2 out of 10 in his anxiety as 1 out of 10 (10 being severely depressed or anxious).  Patient notes that the Seroquel is too strong for him.  He reports that he cut his dose in half once and then recently he cut it in quarters.  He notes that he is overly sedated in the daytime.  He informed provider that he wakes up for work at 4:00 am and is still tired at 11:00 am.  Patient reports that now that he is more mentally clear and his sobriety is successful he finds himself enjoying life more.  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  He notes that he feels like he is impacting his community positively.  He states that he has written [employer] and [employer] about his progress.  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  He also notes that he while get his license back this week Friday.  Patient informed writer that he followed up with a GI specialist for his abdominal pain.  He notes that he was given a sublingual medication to help with abdominal pain. Patient is agreeable to reducing Seroquel 300 mg to 200 mg to reduce sedation and manage her mood.  He will continue all  other medications as prescribed and follow-up with outpatient therapist for counseling.  No other concerns noted at this time.    Visit Diagnosis:    ICD-10-CM   1. Generalized anxiety disorder  F41.1 hydrOXYzine (ATARAX/VISTARIL) 25 MG tablet    sertraline (ZOLOFT) 25 MG tablet  2. Mild episode of recurrent major depressive disorder (HCC)  F33.0 QUEtiapine (SEROQUEL) 200 MG tablet    traZODone (DESYREL) 50 MG tablet    sertraline (ZOLOFT) 25 MG tablet    Past Psychiatric History: anxiety, depression, marijuana use disorder, and methamphetamine use disorder.  Past Medical History:  Past Medical History:  Diagnosis Date  . Anxiety   . Depression    No past surgical history on file.  Family Psychiatric History: Maternal aunts and uncles substance use.   Family History:  Family History  Problem Relation Age of Onset  . Heart disease Mother   . Hypertension Mother   . Diabetes Maternal Uncle   . Prostate cancer Father     Social History:  Social History   Socioeconomic History  . Marital status: Single    Spouse name: Not on file  . Number of children: Not on file  . Years of education: Not on file  . Highest education level: Not on file  Occupational History  . Not on file  Tobacco Use  . Smoking status: Former Smoker  Types: Cigarettes  . Smokeless tobacco: Never Used  . Tobacco comment: "state that he smokes 3 cigarettes a day"  Vaping Use  . Vaping Use: Never used  Substance and Sexual Activity  . Alcohol use: Not Currently  . Drug use: Not Currently  . Sexual activity: Not Currently  Other Topics Concern  . Not on file  Social History Narrative  . Not on file   Social Determinants of Health   Financial Resource Strain:   . Difficulty of Paying Living Expenses:   Food Insecurity:   . Worried About Programme researcher, broadcasting/film/video in the Last Year:   . Barista in the Last Year:   Transportation Needs:   . Freight forwarder (Medical):   Marland Kitchen Lack of  Transportation (Non-Medical):   Physical Activity:   . Days of Exercise per Week:   . Minutes of Exercise per Session:   Stress:   . Feeling of Stress :   Social Connections:   . Frequency of Communication with Friends and Family:   . Frequency of Social Gatherings with Friends and Family:   . Attends Religious Services:   . Active Member of Clubs or Organizations:   . Attends Banker Meetings:   Marland Kitchen Marital Status:     Allergies: No Known Allergies  Metabolic Disorder Labs: Lab Results  Component Value Date   HGBA1C 5.7 (A) 03/02/2020   HGBA1C 5.7 03/02/2020   HGBA1C 5.7 03/02/2020   HGBA1C 5.7 03/02/2020   No results found for: PROLACTIN No results found for: CHOL, TRIG, HDL, CHOLHDL, VLDL, LDLCALC No results found for: TSH  Therapeutic Level Labs: No results found for: LITHIUM No results found for: VALPROATE No components found for:  CBMZ  Current Medications: Current Outpatient Medications  Medication Sig Dispense Refill  . hydrOXYzine (ATARAX/VISTARIL) 25 MG tablet Take 1 tablet (25 mg total) by mouth 3 (three) times daily as needed. 115 tablet 1  . ibuprofen (ADVIL) 800 MG tablet Take 1 tablet (800 mg total) by mouth every 8 (eight) hours as needed. 30 tablet 3  . QUEtiapine (SEROQUEL) 200 MG tablet Take 1 tablet (200 mg total) by mouth at bedtime. 30 tablet 2  . sertraline (ZOLOFT) 25 MG tablet Take 1 tablet (25 mg total) by mouth daily. 30 tablet 1  . traZODone (DESYREL) 50 MG tablet Take 1 tablet (50 mg total) by mouth at bedtime. 30 tablet 1   No current facility-administered medications for this visit.     Musculoskeletal: Strength & Muscle Tone: within normal limits Gait & Station: normal Patient leans: N/A  Psychiatric Specialty Exam: Review of Systems  There were no vitals taken for this visit.There is no height or weight on file to calculate BMI.  General Appearance: Well Groomed  Eye Contact:  Good  Speech:  Clear and Coherent and  Normal Rate  Volume:  Normal  Mood:  Euthymic  Affect:  Appropriate and Congruent  Thought Process:  Coherent, Goal Directed and Linear  Orientation:  Full (Time, Place, and Person)  Thought Content: WDL and Logical   Suicidal Thoughts:  No  Homicidal Thoughts:  No  Memory:  Immediate;   Good Recent;   Good Remote;   Good  Judgement:  Good  Insight:  Good  Psychomotor Activity:  Normal  Concentration:  Concentration: Good and Attention Span: Good  Recall:  Good  Fund of Knowledge: Good  Language: Good  Akathisia:  No  Handed:  Right  AIMS (if  indicated): Not done  Assets:  Communication Skills Desire for Improvement Financial Resources/Insurance Housing Social Support  ADL's:  Intact  Cognition: WNL  Sleep:  Good   Screenings: PHQ2-9     Office Visit from 03/02/2020 in [facility]  PHQ-2 Total Score 0       Assessment and Plan: Patient reports that he is doing well on current medication regimen however notes that his nightly Seroquel causes him to have increased sedation in the daytime.  He is agreeable to reducing Seroquel 300 mg to 200 mg to help reduce sedation and manage mood.  He will continue all other medications as prescribed.  1. Generalized anxiety disorder  Continue- hydrOXYzine (ATARAX/VISTARIL) 25 MG tablet; Take 1 tablet (25 mg total) by mouth 3 (three) times daily as needed.  Dispense: 115 tablet; Refill: 1 Continue- sertraline (ZOLOFT) 25 MG tablet; Take 1 tablet (25 mg total) by mouth daily.  Dispense: 30 tablet; Refill: 1  2. Mild episode of recurrent major depressive disorder (HCC)  Reduced- QUEtiapine (SEROQUEL) 200 MG tablet; Take 1 tablet (200 mg total) by mouth at bedtime.  Dispense: 30 tablet; Refill: 2 Continue- traZODone (DESYREL) 50 MG tablet; Take 1 tablet (50 mg total) by mouth at bedtime.  Dispense: 30 tablet; Refill: 1 Continue- sertraline (ZOLOFT) 25 MG tablet; Take 1 tablet (25 mg total) by mouth daily.  Dispense:  30 tablet; Refill: 1    Shanna Cisco, NP 03/10/2020, 6:06 PM

## 2020-03-16 ENCOUNTER — Ambulatory Visit (HOSPITAL_COMMUNITY)
Admission: RE | Admit: 2020-03-16 | Discharge: 2020-03-16 | Disposition: A | Discharge status: Home / Self Care | Payer: Self-pay | Source: Ambulatory Visit | Attending: Family Medicine | Admitting: Family Medicine

## 2020-03-16 ENCOUNTER — Other Ambulatory Visit: Payer: Self-pay

## 2020-03-16 DIAGNOSIS — R1032 Left lower quadrant pain: Secondary | ICD-10-CM | POA: Insufficient documentation

## 2020-03-18 ENCOUNTER — Ambulatory Visit (INDEPENDENT_AMBULATORY_CARE_PROVIDER_SITE_OTHER): Payer: No Payment, Other | Admitting: Licensed Clinical Social Worker

## 2020-03-18 ENCOUNTER — Other Ambulatory Visit: Payer: Self-pay

## 2020-03-18 DIAGNOSIS — F411 Generalized anxiety disorder: Secondary | ICD-10-CM

## 2020-03-19 ENCOUNTER — Encounter: Payer: Self-pay | Admitting: Family Medicine

## 2020-03-22 NOTE — Progress Notes (Signed)
   THERAPIST PROGRESS NOTE  Session Time: 30 min  Participation Level: Active  Behavioral Response: CasualAlertEuthymic  Type of Therapy: Individual Therapy  Treatment Goals addressed: Anxiety and Coping  Interventions: Motivational Interviewing and Supportive  Summary: Tyrone White is a 34 y.o. male who presents with hx of GAD. This date pt comes for in person session. He is in a positive mood. He states he was able to get his driver's license on Aug 20th as planned and shows LCSW his temporary card. He expects to get his permanent card within 10 days. LCSW congratulated pt and provided much positive reinforcement for meeting this goal and being in a position in life to achieve the goal r/t his sobriety. Pt able to say he is proud of himself. LCSW assessed for pt's communication with customer at car wash who was bringing him alcohol. He states the customer has not been back since last session. He confirms he intends to decline alcohol and ask customer not to bring it to him any more after last session and thinking about consequences. He is continuing to use cannabis. He is continuing to have paranoia and today admits it is usually in conjunction with smoking pot. Assisted to process this choice and it's possible consequences. Provided education on Stop/Think/Listen/Plan. Pt verbalizes understanding. Pt states he is reading the self talk literature provided by this cliniican daily q morning. He has it hanging on his wall. He reports how useful it is to get his day started in a thoughtful way. Assessed for med management appt. Pt reports that went well and there was a reduction in his seroquel saying he has more energy with the adjustment. He further reports outcome of his CT r/t abdominal pain and has a new med that has this problem well managed. Commended pt for his positive thinking/actions. Encouraged pt to be in communication with his aunt and uncle to let them know how well he is doing at  this time. Reviewed poc with pt's verbal acceptance. Pt progressing well and states how much he appreciates counseling sessions.       Suicidal/Homicidal: Nowithout intent/plan  Therapist Response: Pt remains receptive to care.  Plan: Return again in 2 weeks.  Diagnosis: Axis I: Generalized Anxiety Disorder    Axis II: Deferred  Milton Sink, LCSW 03/22/2020

## 2020-04-01 ENCOUNTER — Ambulatory Visit (INDEPENDENT_AMBULATORY_CARE_PROVIDER_SITE_OTHER): Payer: No Payment, Other | Admitting: Licensed Clinical Social Worker

## 2020-04-01 ENCOUNTER — Telehealth: Payer: Self-pay | Admitting: Family Medicine

## 2020-04-01 ENCOUNTER — Other Ambulatory Visit: Payer: Self-pay

## 2020-04-01 DIAGNOSIS — F411 Generalized anxiety disorder: Secondary | ICD-10-CM | POA: Diagnosis not present

## 2020-04-02 NOTE — Progress Notes (Signed)
   THERAPIST PROGRESS NOTE  Session Time: 45 min  Participation Level: Active  Behavioral Response: CasualAlertAnxious  Type of Therapy: Individual Therapy  Treatment Goals addressed: Anxiety and Coping  Interventions: Motivational Interviewing and Supportive  Summary: Tyrone White is a 34 y.o. male who presents with hx of GAD and alcohol abuse. This date pt comes for in person session. Pt reports he has been thinking of leaving the program he is in and getting out on his own. He asks questions about housing and states he does not plan to return to the area he is from, plans to stay in this area. Pt has been in program 7 mon as of yesterday. He will graduate at 9 mon. Additional assessment reveals pt is frustrated with current post at the [employer]. He feels he is being ordered around and being treated unfairly. He is not drinking and has not smoked cannabis in 2 days but admits this is only because he does not have any. Again discussed elevated paranoia with cannabis. Pt is insightful to say he needs to work on being "more patient and not taking everything the wrong way".  He recalls Stop/think/listen/plan strategy. With further processing and discussion pt states he is going to stay in the program. LCSW commended decision. LCSW provided information on [facility] for pt for preplanning upon graduation. He will ask [facility] staff for help to get him an appoint. Assessed for contact with aunt/uncle. Pt reports he did call but his aunt could not talk and he only gets one call. He also states attorney says he has no court date but he does not know if case completely dismissed. Pt has received his permanent drivers license he is proud of. LCSW facilitated additional review/education of self talk with ongoing focus and practice. Reviewed poc with pt's verbal acceptance prior to close of session. Pt states appreciation for care.       Suicidal/Homicidal: Nowithout  intent/plan  Therapist Response: Pt remains receptive to care.  Plan: Return again in 2 weeks.  Diagnosis: Axis I: Generalized Anxiety Disorder    Axis II: Deferred  Sedley Sink, LCSW 04/02/2020

## 2020-04-15 ENCOUNTER — Ambulatory Visit (HOSPITAL_COMMUNITY): Payer: No Payment, Other | Admitting: Licensed Clinical Social Worker

## 2020-04-15 ENCOUNTER — Other Ambulatory Visit: Payer: Self-pay

## 2020-04-15 ENCOUNTER — Telehealth (HOSPITAL_COMMUNITY): Payer: Self-pay | Admitting: Licensed Clinical Social Worker

## 2020-04-15 NOTE — Telephone Encounter (Signed)
When pt failed to show up for in person session LCSW phoned the transportation contact, Jillyn Hidden, at [employer]. 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 He states pt is doing well and for any further details needed he referred me to the office at [phone]. With knowing pt is doing well did not seek further information this date.

## 2020-04-29 ENCOUNTER — Ambulatory Visit (INDEPENDENT_AMBULATORY_CARE_PROVIDER_SITE_OTHER): Payer: No Payment, Other | Admitting: Licensed Clinical Social Worker

## 2020-04-29 ENCOUNTER — Other Ambulatory Visit: Payer: Self-pay

## 2020-04-29 DIAGNOSIS — F411 Generalized anxiety disorder: Secondary | ICD-10-CM

## 2020-04-30 NOTE — Progress Notes (Signed)
   THERAPIST PROGRESS NOTE  Session Time: 30 min  Participation Level: Active  Behavioral Response: CasualAlertAnxious  Type of Therapy: Individual Therapy  Treatment Goals addressed: Anxiety and Coping  Interventions: CBT, Motivational Interviewing and Supportive  Summary: Tyrone White is a 34 y.o. male who presents with hx of GAD. This date pt comes for in person appt per his preference. Pt brought his permanent drivers license to show LCSW. License is good until 2029. Commended pt for getting this goal met. Pt reports someone who is a regular customer at the car wash gave him a car. He states it is a Education administrator in good condition. Pt is excited about the vehicle which he acknowledges he cannot use at this time. He further acknowledged this frustrates him. The care is reportedly staying parked over at the car wash and the person plans to get the title signed over to him. Assessed if this was the same person bringing him alcohol. Pt confirms this and states he asked the person not to bring him any more alcohol and person agreed. Commended pt for following through with this suggestion/choice. Pt then reports "I used yesterday". Further assessment reveals pt knew someone who could get him some opioids and he asked for some which he snorted. Pt disappointed and regretful of his actions. He reports he feels terrible physically and emotionally. Assessment of the root cause of decision to use reveals it was related to getting the car. He wanted to celebrate, which he used to always do by getting high. Pt shares details of his remorse after God has brought him this far and also confesses he has again thought of just leaving the program since he has this car. Remainder of session spent addressing choices, long term and short term gratification, with much education. Pt states, "Now I'm telling myself I have to fight and push through it". LCSW provided encouragement for this choice. Reviewed self talk  and it's powerful influence. Asked pt to read self talk lit again. Provided pt with a printed sign "Fight and Push Through it", noting what a good message this is. Pt states he will be staying in the program with intent to remain clean/sober. LCSW reviewed poc with pt's verbal acceptance and agreement prior to close of session. Pt states appreciation for care.  Suicidal/Homicidal: Nowithout intent/plan  Therapist Response: Pt remains receptive to care.  Plan: Return again in 2 weeks.  Diagnosis: Axis I: Generalized Anxiety Disorder    Axis II: Deferred  Hermine Messick, LCSW 04/30/2020

## 2020-05-12 ENCOUNTER — Ambulatory Visit (HOSPITAL_COMMUNITY): Payer: No Payment, Other | Admitting: Licensed Clinical Social Worker

## 2020-05-25 ENCOUNTER — Ambulatory Visit (INDEPENDENT_AMBULATORY_CARE_PROVIDER_SITE_OTHER): Payer: No Payment, Other | Admitting: Licensed Clinical Social Worker

## 2020-05-25 ENCOUNTER — Other Ambulatory Visit: Payer: Self-pay

## 2020-05-25 DIAGNOSIS — F411 Generalized anxiety disorder: Secondary | ICD-10-CM | POA: Diagnosis not present

## 2020-05-26 NOTE — Progress Notes (Signed)
   THERAPIST PROGRESS NOTE  Session Time: 38 min  Participation Level: Active  Behavioral Response: CasualAlertNegative, Anxious and Dysphoric  Type of Therapy: Individual Therapy  Treatment Goals addressed: Coping  Interventions: CBT, Motivational Interviewing and Supportive  Summary: Tyrone White is a 34 y.o. male who presents with hx of GAD and addiction problem. This date [facility] driver calls to say pt will be late for session, LCSW agrees to abbreviated session. Upon arrival pt presents as anxious and down. Pt reports he has relapsed again. He provides details of being able to get oxycodone while working at [employer]. LCSW assessed for who initiator of transaction was. Pt states he is the one who asked about obtaining. He states he snorted oxy 3 different times. He also admits he has asked for the man at the car wash to bring him alcohol again. Pt advises he got caught. He states because he was acting differently he was drug tested after last use last Thurs. He almost got kicked out of program. 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 He is remorseful and displeased with self. Remainder of session spent addressing coping and decision making. Reviewed poc prior to close of session. Pt states appreciation for care.  Suicidal/Homicidal: Nowithout intent/plan  Therapist Response: Pt remains receptive to care.  Plan: Return again in 4 weeks.  Diagnosis: Axis I: Generalized Anxiety Disorder    Axis II: Deferred  Mapleton Sink, LCSW 05/26/2020

## 2020-06-02 ENCOUNTER — Encounter: Payer: Self-pay | Admitting: Family Medicine

## 2020-06-03 ENCOUNTER — Ambulatory Visit (HOSPITAL_COMMUNITY): Payer: Self-pay | Admitting: Licensed Clinical Social Worker

## 2020-06-04 ENCOUNTER — Other Ambulatory Visit (HOSPITAL_COMMUNITY): Payer: Self-pay | Admitting: Psychiatry

## 2020-06-04 ENCOUNTER — Other Ambulatory Visit: Payer: Self-pay | Admitting: Family Medicine

## 2020-06-04 DIAGNOSIS — F330 Major depressive disorder, recurrent, mild: Secondary | ICD-10-CM

## 2020-06-04 DIAGNOSIS — F411 Generalized anxiety disorder: Secondary | ICD-10-CM

## 2020-06-07 ENCOUNTER — Ambulatory Visit (HOSPITAL_COMMUNITY)
Admission: EM | Admit: 2020-06-07 | Discharge: 2020-06-07 | Disposition: A | Discharge status: Home / Self Care | Payer: Self-pay | Attending: Family Medicine | Admitting: Family Medicine

## 2020-06-07 ENCOUNTER — Encounter (HOSPITAL_COMMUNITY): Payer: Self-pay | Admitting: Emergency Medicine

## 2020-06-07 ENCOUNTER — Other Ambulatory Visit: Payer: Self-pay

## 2020-06-07 ENCOUNTER — Ambulatory Visit (INDEPENDENT_AMBULATORY_CARE_PROVIDER_SITE_OTHER): Payer: Self-pay

## 2020-06-07 DIAGNOSIS — S60221A Contusion of right hand, initial encounter: Secondary | ICD-10-CM

## 2020-06-07 DIAGNOSIS — W19XXXA Unspecified fall, initial encounter: Secondary | ICD-10-CM

## 2020-06-07 DIAGNOSIS — M79641 Pain in right hand: Secondary | ICD-10-CM

## 2020-06-07 NOTE — Discharge Instructions (Signed)
No broken bones on x ray Ace wrap and Ice for comfort Follow up as needed for continued or worsening symptoms

## 2020-06-07 NOTE — ED Provider Notes (Signed)
[facility]    CSN: [id number] Arrival date & time: 06/07/20  1035      History   Chief Complaint Chief Complaint  Patient presents with  . Hand Injury    HPI Tyrone White is a 34 y.o. male.   Patient is a 22 male presents today with right hand injury.  During that 6 days ago he fell on concrete with his fists balled and struck the pavement with his hand.  There is been some generalized swelling to the hand.  He has been using Tylenol and IcyHot without much relief.  No numbness, tingling, deformities.     Past Medical History:  Diagnosis Date  . Anxiety   . 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 Depression     Patient Active Problem List   Diagnosis Date Noted  . Generalized anxiety disorder 01/29/2020  . 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 Left lower quadrant abdominal pain 01/29/2020    History reviewed. No pertinent surgical history.     Home Medications    Prior to Admission medications   Medication Sig Start Date End Date Taking? Authorizing Provider  hydrOXYzine (ATARAX/VISTARIL) 25 MG tablet Take 1 tablet (25 mg total) by mouth 3 (three) times daily as needed. 03/10/20   Shanna Cisco, NP  ibuprofen (ADVIL) 800 MG tablet Take 1 tablet (800 mg total) by mouth every 8 (eight) hours as needed. 03/02/20   Kallie Locks, FNP  QUEtiapine (SEROQUEL) 200 MG tablet Take 1 tablet (200 mg total) by mouth at bedtime. 03/10/20   Shanna Cisco, NP  sertraline (ZOLOFT) 25 MG tablet Take 1 tablet (25 mg total) by mouth daily. 03/10/20   Shanna Cisco, NP  traZODone (DESYREL) 50 MG tablet Take 1 tablet (50 mg total) by mouth at bedtime. 03/10/20   Shanna Cisco, NP    Family History Family History  Problem Relation Age of Onset  . Heart disease Mother   . Hypertension Mother   . Diabetes Maternal Uncle   . Prostate cancer Father     Social  History Social History   Tobacco Use  . Smoking status: Former Smoker    Types: Cigarettes  . Smokeless tobacco: Never Used  . Tobacco comment: "state that he smokes 3 cigarettes a day"  Vaping Use  . Vaping Use: Never used  Substance Use Topics  . Alcohol use: Not Currently  . Drug use: Not Currently     Allergies   Patient has no known allergies.   Review of Systems Review of Systems   Physical Exam Triage Vital Signs ED Triage Vitals  Enc Vitals Group     BP 06/07/20 1055 131/75     Pulse Rate 06/07/20 1055 71     Resp --      Temp 06/07/20 1055 97.9 F (36.6 C)     Temp Source 06/07/20 1055 Oral     SpO2 06/07/20 1055 98 %     Weight --      Height --      Head Circumference --      Peak Flow --      Pain Score 06/07/20 1051 7     Pain Loc --      Pain Edu? --      Excl. in GC? --    No data found.  Updated Vital Signs BP 131/75 (BP Location: Left Arm)  Pulse 71   Temp 97.9 F (36.6 C) (Oral)   SpO2 98%   Visual Acuity Right Eye Distance:   Left Eye Distance:   Bilateral Distance:    Right Eye Near:   Left Eye Near:    Bilateral Near:     Physical Exam Vitals and nursing note reviewed.  Constitutional:      Appearance: Normal appearance.  HENT:     Head: Normocephalic and atraumatic.     Nose: Nose normal.  Eyes:     Conjunctiva/sclera: Conjunctivae normal.  Pulmonary:     Effort: Pulmonary effort is normal.  Musculoskeletal:        General: Normal range of motion.     Right hand: Swelling, tenderness and bony tenderness present. Normal range of motion. Normal strength.       Hands:     Cervical back: Normal range of motion.     Comments: Generalized swelling, bruising.  2+ radial pulse.   Skin:    General: Skin is warm and dry.  Neurological:     Mental Status: He is alert.  Psychiatric:        Mood and Affect: Mood normal.      UC Treatments / Results  Labs (all labs ordered are listed, but only abnormal results are  displayed) Labs Reviewed - No data to display  EKG   Radiology DG Hand Complete Right  Result Date: 06/07/2020 CLINICAL DATA:  Pain following fall EXAM: RIGHT HAND - COMPLETE 3+ VIEW COMPARISON:  None. FINDINGS: Frontal, oblique, and lateral views were obtained. No fracture or dislocation. Joint spaces appear normal. No erosive change. IMPRESSION: No fracture or dislocation.  No appreciable arthropathic change. Electronically Signed   By: Bretta Bang III M.D.   On: 06/07/2020 11:45    Procedures Procedures (including critical care time)  Medications Ordered in UC Medications - No data to display  Initial Impression / Assessment and Plan / UC Course  I have reviewed the triage vital signs and the nursing notes.  Pertinent labs & imaging results that were available during my care of the patient were reviewed by me and considered in my medical decision making (see chart for details).     Contusion of the right hand.  No acute fracture on x ray.  Rest, Ice, ace wrap Follow up as needed for continued or worsening symptoms  Final Clinical Impressions(s) / UC Diagnoses   Final diagnoses:  Contusion of right hand, initial encounter     Discharge Instructions     No broken bones on x ray Ace wrap and Ice for comfort Follow up as needed for continued or worsening symptoms    ED Prescriptions    None     PDMP not reviewed this encounter.   Janace Aris, NP 06/07/20 1217

## 2020-06-07 NOTE — ED Triage Notes (Signed)
Pt c/o right hand injury x 6 days ago. Pt states he fell on the concrete with his fist balled. He states he has been taking tylenol and using icy hot with no relief.

## 2020-06-08 ENCOUNTER — Ambulatory Visit (HOSPITAL_COMMUNITY): Payer: Self-pay | Admitting: Licensed Clinical Social Worker

## 2020-06-09 ENCOUNTER — Ambulatory Visit (INDEPENDENT_AMBULATORY_CARE_PROVIDER_SITE_OTHER): Payer: No Payment, Other | Admitting: Psychiatry

## 2020-06-09 ENCOUNTER — Other Ambulatory Visit: Payer: Self-pay

## 2020-06-09 ENCOUNTER — Encounter (HOSPITAL_COMMUNITY): Payer: Self-pay | Admitting: Psychiatry

## 2020-06-09 DIAGNOSIS — F330 Major depressive disorder, recurrent, mild: Secondary | ICD-10-CM | POA: Diagnosis not present

## 2020-06-09 DIAGNOSIS — F411 Generalized anxiety disorder: Secondary | ICD-10-CM

## 2020-06-09 MED ORDER — TRAZODONE HCL 50 MG PO TABS: 50.0000 mg | ORAL_TABLET | Freq: Every evening | ORAL | 2 refills | Status: DC | PRN

## 2020-06-09 MED ORDER — QUETIAPINE FUMARATE 200 MG PO TABS: 200.0000 mg | ORAL_TABLET | Freq: Every day | ORAL | 2 refills | Status: DC

## 2020-06-09 MED ORDER — BUSPIRONE HCL 10 MG PO TABS: 10.0000 mg | ORAL_TABLET | Freq: Three times a day (TID) | ORAL | 2 refills | Status: DC

## 2020-06-09 NOTE — Progress Notes (Signed)
[facility] MD/PA/NP OP Progress Note  06/09/2020 9:35 AM Tyrone White  MRN:  [id number]  Chief Complaint: "I stopped the Zoloft but I still need something for anxiety" Chief Complaint    Medication Management      HPI: 34 year old male seen today for follow up psychiatric evaluation.   He has a psychiatric history of anxiety, depression, marijuana use disorder, and methamphetamine use disorder.  Patient is currently being managed on Zoloft 25 mg daily,  hydroxyzine 25 mg three times a day, Seroquel 200 mg at bedtime, and trazodone 50 mg at bedtime.    He reports that he discontinued her Zoloft, finds hydroxyzine ineffective, and dislikes trazodone.  Today he is well-groomed, pleasant, cooperative, engaged in conversation, and maintained eye contact.  He informed provider that he is not depressed however anxious.  Provider conducted a PHQ-9 and patient scored a 3.  Provider also conducted a GAD-7 and patient scored a 9.  He informed provider that his sleep is disturbed and notes that he wakes up throughout the night.  He informed provider that he is unaware of how many hours he is getting of sleep.  Provider asked patient if he was taking his trazodone and he notes that he was not because he does not want to be on many medications.  Provider informed patient that trazodone dose could be reduced to 25 mg nightly as needed instead of scheduled.  He endorsed understanding and agreed.  Patient notes that he dislikes Zoloft and has discontinued it for over a month.  He notes however that he would like to start on a medication that will target his anxiety.  Provider informed patient that Zoloft does help with anxiety and depression.  He endorsed understanding however noted that he did not want to restart it and noted that he found hydroxyzine ineffective.  Today he denies SI/HI/VH or paranoia.   Patient is agreeable to reducing trazodone 50mg  to 25 to 50 mg as needed for sleep.  He is also agreeable to  discontinue Zoloft and hydroxyzine.  He will start BuSpar 10 mg 3 times daily for anxiety Potential side effects of medication and risks vs benefits of treatment vs non-treatment were explained and discussed. All questions were answered. He will follow-up with outpatient therapist for counseling.  No other concerns noted at this time.    Visit Diagnosis:    ICD-10-CM   1. Generalized anxiety disorder  F41.1 busPIRone (BUSPAR) 10 MG tablet  2. Mild episode of recurrent major depressive disorder (HCC)  F33.0 QUEtiapine (SEROQUEL) 200 MG tablet    traZODone (DESYREL) 50 MG tablet    Past Psychiatric History: anxiety, depression, marijuana use disorder, and methamphetamine use disorder.  Past Medical History:  Past Medical History:  Diagnosis Date  . Anxiety   . 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 Depression    No past surgical history on file.  Family Psychiatric History: Maternal aunts and uncles substance use.   Family History:  Family History  Problem Relation Age of Onset  . Heart disease Mother   . Hypertension Mother   . Diabetes Maternal Uncle   . Prostate cancer Father     Social History:  Social History   Socioeconomic History  . Marital status: Single    Spouse name: Not on file  . Number of children: Not on file  . Years of education: Not on file  . Highest education level: Not on file  Occupational  History  . Not on file  Tobacco Use  . Smoking status: Former Smoker    Types: Cigarettes  . Smokeless tobacco: Never Used  . Tobacco comment: "state that he smokes 3 cigarettes a day"  Vaping Use  . Vaping Use: Never used  Substance and Sexual Activity  . Alcohol use: Not Currently  . Drug use: Not Currently  . Sexual activity: Not Currently  Other Topics Concern  . Not on file  Social History Narrative  . Not on file   Social Determinants of Health   Financial Resource Strain:   . Difficulty of Paying Living  Expenses: Not on file  Food Insecurity:   . Worried About Programme researcher, broadcasting/film/video in the Last Year: Not on file  . Ran Out of Food in the Last Year: Not on file  Transportation Needs:   . Lack of Transportation (Medical): Not on file  . Lack of Transportation (Non-Medical): Not on file  Physical Activity:   . Days of Exercise per Week: Not on file  . Minutes of Exercise per Session: Not on file  Stress:   . Feeling of Stress : Not on file  Social Connections:   . Frequency of Communication with Friends and Family: Not on file  . Frequency of Social Gatherings with Friends and Family: Not on file  . Attends Religious Services: Not on file  . Active Member of Clubs or Organizations: Not on file  . Attends Banker Meetings: Not on file  . Marital Status: Not on file    Allergies: No Known Allergies  Metabolic Disorder Labs: Lab Results  Component Value Date   HGBA1C 5.7 (A) 03/02/2020   HGBA1C 5.7 03/02/2020   HGBA1C 5.7 03/02/2020   HGBA1C 5.7 03/02/2020   No results found for: PROLACTIN No results found for: CHOL, TRIG, HDL, CHOLHDL, VLDL, LDLCALC No results found for: TSH  Therapeutic Level Labs: No results found for: LITHIUM No results found for: VALPROATE No components found for:  CBMZ  Current Medications: Current Outpatient Medications  Medication Sig Dispense Refill  . busPIRone (BUSPAR) 10 MG tablet Take 1 tablet (10 mg total) by mouth 3 (three) times daily. 90 tablet 2  . ibuprofen (ADVIL) 800 MG tablet Take 1 tablet (800 mg total) by mouth every 8 (eight) hours as needed. 30 tablet 3  . QUEtiapine (SEROQUEL) 200 MG tablet Take 1 tablet (200 mg total) by mouth at bedtime. 30 tablet 2  . traZODone (DESYREL) 50 MG tablet Take 1 tablet (50 mg total) by mouth at bedtime as needed for sleep. 30 tablet 2   No current facility-administered medications for this visit.     Musculoskeletal: Strength & Muscle Tone: within normal limits Gait & Station:  normal Patient leans: N/A  Psychiatric Specialty Exam: Review of Systems  Blood pressure 135/84, pulse 68, height 6' (1.829 m), weight 180 lb (81.6 kg), SpO2 100 %.Body mass index is 24.41 kg/m.  General Appearance: Well Groomed  Eye Contact:  Good  Speech:  Clear and Coherent and Normal Rate  Volume:  Normal  Mood:  Anxious and Euthymic  Affect:  Appropriate and Congruent  Thought Process:  Coherent, Goal Directed and Linear  Orientation:  Full (Time, Place, and Person)  Thought Content: WDL and Logical   Suicidal Thoughts:  No  Homicidal Thoughts:  No  Memory:  Immediate;   Good Recent;   Good Remote;   Good  Judgement:  Good  Insight:  Good  Psychomotor  Activity:  Normal  Concentration:  Concentration: Good and Attention Span: Good  Recall:  Good  Fund of Knowledge: Good  Language: Good  Akathisia:  No  Handed:  Right  AIMS (if indicated): Not done  Assets:  Communication Skills Desire for Improvement Financial Resources/Insurance Housing Social Support  ADL's:  Intact  Cognition: WNL  Sleep:  Fair   Screenings: GAD-7     Clinical Support from 06/09/2020 in [facility]  Total GAD-7 Score 9    PHQ2-9     Clinical Support from 06/09/2020 in [facility] Office Visit from 03/02/2020 in [facility]  PHQ-2 Total Score 0 0  PHQ-9 Total Score 3 --       Assessment and Plan: Patient endorses symptoms of anxiety and disturbed sleep. He is agreeable to reducing trazodone 50mg  to 25 to 50 mg as needed for sleep.  He is also agreeable to discontinue Zoloft and hydroxyzine.  He will start BuSpar 10 mg 3 times daily for anxie  1. Generalized anxiety disorder  Start- busPIRone (BUSPAR) 10 MG tablet; Take 1 tablet (10 mg total) by mouth 3 (three) times daily.  Dispense: 90 tablet; Refill: 2  2. Mild episode of recurrent major depressive disorder (HCC)  Continue- QUEtiapine (SEROQUEL) 200 MG  tablet; Take 1 tablet (200 mg total) by mouth at bedtime.  Dispense: 30 tablet; Refill: 2 Reduced- traZODone (DESYREL) 50 MG tablet; Take 1 tablet (50 mg total) by mouth at bedtime as needed for sleep.  Dispense: 30 tablet; Refill: 2  Follow-up in 2 months Follow-up with therapy , NP 06/09/2020, 9:35 AM

## 2020-06-10 ENCOUNTER — Encounter (HOSPITAL_COMMUNITY): Payer: No Payment, Other | Admitting: Psychiatry

## 2020-06-16 ENCOUNTER — Ambulatory Visit (HOSPITAL_COMMUNITY): Payer: Self-pay | Admitting: Licensed Clinical Social Worker

## 2020-06-23 ENCOUNTER — Ambulatory Visit (INDEPENDENT_AMBULATORY_CARE_PROVIDER_SITE_OTHER): Payer: Self-pay | Admitting: Family Medicine

## 2020-06-23 ENCOUNTER — Other Ambulatory Visit: Payer: Self-pay

## 2020-06-23 ENCOUNTER — Encounter: Payer: Self-pay | Admitting: Family Medicine

## 2020-06-23 VITALS — BP 121/83 | HR 70 | Temp 98.0°F | Resp 16 | Ht 72.0 in | Wt 190.8 lb

## 2020-06-23 DIAGNOSIS — Z0000 Encounter for general adult medical examination without abnormal findings: Secondary | ICD-10-CM

## 2020-06-23 DIAGNOSIS — G8929 Other chronic pain: Secondary | ICD-10-CM

## 2020-06-23 DIAGNOSIS — F411 Generalized anxiety disorder: Secondary | ICD-10-CM

## 2020-06-23 DIAGNOSIS — R1084 Generalized abdominal pain: Secondary | ICD-10-CM

## 2020-06-23 DIAGNOSIS — Z09 Encounter for follow-up examination after completed treatment for conditions other than malignant neoplasm: Secondary | ICD-10-CM

## 2020-06-23 NOTE — Progress Notes (Signed)
[facility]    Established Patient Office Visit  Subjective:  Patient ID: Tyrone White, male    DOB: 12/24/1985  Age: 34 y.o. MRN: [id number]  CC:  Chief Complaint  Patient presents with  . Follow-up    Pt is concerned about a medication     HPI Tyrone White is a 34 year old male who presents for Follow Up today.    Patient Active Problem List   Diagnosis Date Noted  . Generalized anxiety disorder 01/29/2020  . 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 Left lower quadrant abdominal pain 01/29/2020    Current Status: Since his last office visit, he has has an ED visit Hand Contusion on 06/07/2020. Today, he has c/o right hand pain. He denies fevers, chills, fatigue, recent infections, weight loss, and night sweats. He has not had any headaches, visual changes, dizziness, and falls. No chest pain, heart palpitations, cough and shortness of breath reported. Denies GI problems such as nausea, vomiting, diarrhea, and constipation. He has no reports of blood in stools, dysuria and hematuria. No depression or anxiety, and denies suicidal ideations, homicidal ideations, or auditory hallucinations. He is taking all medications as prescribed.    Past Medical History:  Diagnosis Date  . Anxiety   . 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 Depression     No past surgical history on file.  Family History  Problem Relation Age of Onset  . Heart disease Mother   . Hypertension Mother   . Diabetes Maternal Uncle   . Prostate cancer Father     Social History   Socioeconomic History  . Marital status: Single    Spouse name: Not on file  . Number of children: Not on file  . Years of education: Not on file  . Highest education level: Not on file  Occupational History  . Not on file  Tobacco Use  . Smoking status: Former Smoker    Types: Cigarettes  .  Smokeless tobacco: Never Used  . Tobacco comment: "state that he smokes 3 cigarettes a day"  Vaping Use  . Vaping Use: Never used  Substance and Sexual Activity  . Alcohol use: Not Currently  . Drug use: Not Currently  . Sexual activity: Not Currently  Other Topics Concern  . Not on file  Social History Narrative  . Not on file   Social Determinants of Health   Financial Resource Strain:   . Difficulty of Paying Living Expenses: Not on file  Food Insecurity:   . Worried About Programme researcher, broadcasting/film/video in the Last Year: Not on file  . Ran Out of Food in the Last Year: Not on file  Transportation Needs:   . Lack of Transportation (Medical): Not on file  . Lack of Transportation (Non-Medical): Not on file  Physical Activity:   . Days of Exercise per Week: Not on file  . Minutes of Exercise per Session: Not on file  Stress:   . Feeling of Stress : Not on file  Social Connections:   . Frequency of Communication with Friends and Family: Not on file  . Frequency of Social Gatherings with Friends and Family: Not on file  . Attends Religious Services: Not on file  . Active Member of Clubs or Organizations: Not on file  . Attends Banker Meetings: Not on file  . Marital Status: Not  on file  Intimate Partner Violence:   . Fear of Current or Ex-Partner: Not on file  . Emotionally Abused: Not on file  . Physically Abused: Not on file  . Sexually Abused: Not on file    Outpatient Medications Prior to Visit  Medication Sig Dispense Refill  . busPIRone (BUSPAR) 10 MG tablet Take 1 tablet (10 mg total) by mouth 3 (three) times daily. 90 tablet 2  . ibuprofen (ADVIL) 800 MG tablet Take 1 tablet (800 mg total) by mouth every 8 (eight) hours as needed. 30 tablet 3  . QUEtiapine (SEROQUEL) 200 MG tablet Take 1 tablet (200 mg total) by mouth at bedtime. 30 tablet 2  . traZODone (DESYREL) 50 MG tablet Take 1 tablet (50 mg total) by mouth at bedtime as needed for sleep. (Patient not  taking: Reported on 06/23/2020) 30 tablet 2   No facility-administered medications prior to visit.    No Known Allergies  ROS Review of Systems  Constitutional: Negative.   HENT: Negative.   Eyes: Negative.   Respiratory: Negative.   Cardiovascular: Negative.   Gastrointestinal: Negative.   Endocrine: Negative.   Genitourinary: Negative.   Musculoskeletal: Positive for arthralgias (generalized ).  Skin: Negative.   Allergic/Immunologic: Negative.   Neurological: Positive for dizziness (occasional ) and headaches (occasional ).  Hematological: Negative.   Psychiatric/Behavioral: Positive for confusion.      Objective:    Physical Exam Vitals and nursing note reviewed.  Constitutional:      Appearance: Normal appearance.  HENT:     Head: Normocephalic and atraumatic.     Nose: Nose normal.     Mouth/Throat:     Mouth: Mucous membranes are moist.     Pharynx: Oropharynx is clear.  Cardiovascular:     Rate and Rhythm: Normal rate and regular rhythm.     Pulses: Normal pulses.     Heart sounds: Normal heart sounds.  Pulmonary:     Effort: Pulmonary effort is normal.     Breath sounds: Normal breath sounds.  Abdominal:     General: Bowel sounds are normal.     Palpations: Abdomen is soft.  Musculoskeletal:        General: Normal range of motion.     Cervical back: Normal range of motion and neck supple.  Skin:    General: Skin is warm and dry.  Neurological:     General: No focal deficit present.     Mental Status: He is alert and oriented to person, place, and time.  Psychiatric:        Mood and Affect: Mood normal.        Behavior: Behavior normal.        Thought Content: Thought content normal.        Judgment: Judgment normal.     BP 121/83 (BP Location: Left Arm, Patient Position: Sitting, Cuff Size: Large)   Pulse 70   Temp 98 F (36.7 C)   Resp 16   Ht 6' (1.829 m)   Wt 190 lb 12.8 oz (86.5 kg)   SpO2 99%   BMI 25.88 kg/m  Wt Readings from  Last 3 Encounters:  06/23/20 190 lb 12.8 oz (86.5 kg)  03/02/20 180 lb 12.8 oz (82 kg)  10/16/19 160 lb (72.6 kg)     Health Maintenance Due  Topic Date Due  . Hepatitis C Screening  Never done  . COVID-19 Vaccine (1) Never done  . HIV Screening  Never done  There are no preventive care reminders to display for this patient.  Lab Results  Component Value Date   TSH 1.380 06/23/2020   Lab Results  Component Value Date   WBC 6.0 06/23/2020   HGB 14.2 06/23/2020   HCT 42.9 06/23/2020   MCV 90 06/23/2020   PLT 124 (L) 06/23/2020   Lab Results  Component Value Date   NA 140 06/23/2020   K 4.6 06/23/2020   CO2 28 06/23/2020   GLUCOSE 112 (H) 06/23/2020   BUN 12 06/23/2020   CREATININE 1.03 06/23/2020   BILITOT <0.2 06/23/2020   ALKPHOS 82 06/23/2020   AST 25 06/23/2020   ALT 21 06/23/2020   PROT 7.2 06/23/2020   ALBUMIN 4.9 06/23/2020   CALCIUM 10.0 06/23/2020   Lab Results  Component Value Date   CHOL 164 06/23/2020   Lab Results  Component Value Date   HDL 50 06/23/2020   Lab Results  Component Value Date   LDLCALC 98 06/23/2020   Lab Results  Component Value Date   TRIG 84 06/23/2020   Lab Results  Component Value Date   CHOLHDL 3.3 06/23/2020   Lab Results  Component Value Date   HGBA1C 5.7 (A) 03/02/2020   HGBA1C 5.7 03/02/2020   HGBA1C 5.7 03/02/2020   HGBA1C 5.7 03/02/2020      Assessment & Plan:   1. Generalized anxiety disorder - Ambulatory referral to Psychiatry  2. Chronic generalized abdominal pain  3. Healthcare maintenance - CBC with Differential - Comprehensive metabolic panel - TSH - Lipid Panel - Vitamin B12 - Vitamin D, 25-hydroxy  4. Follow up Follow up in 6 months.   No orders of the defined types were placed in this encounter.   Orders Placed This Encounter  Procedures  . CBC with Differential  . Comprehensive metabolic panel  . TSH  . Lipid Panel  . Vitamin B12  . Vitamin D, 25-hydroxy  .  Ambulatory referral to Psychiatry     Referral Orders     Ambulatory referral to Psychiatry   Raliegh Ip, MSN, ANE, FNP-BC [facility] [address] [place], [place] [address] [phone] fax  Problem List Items Addressed This Visit      Other   Generalized anxiety disorder - Primary   Relevant Orders   Ambulatory referral to Psychiatry    Other Visit Diagnoses    Chronic generalized abdominal pain       Healthcare maintenance       Relevant Orders   CBC with Differential (Completed)   Comprehensive metabolic panel (Completed)   TSH (Completed)   Lipid Panel (Completed)   Vitamin B12 (Completed)   Vitamin D, 25-hydroxy (Completed)   Follow up          No orders of the defined types were placed in this encounter.   Follow-up: Return in about 6 months (around 12/22/2020).    Kallie Locks, FNP

## 2020-06-24 LAB — CBC WITH DIFFERENTIAL/PLATELET
Basophils Absolute: 0 10*3/uL (ref 0.0–0.2)
Basos: 1 %
EOS (ABSOLUTE): 0.2 10*3/uL (ref 0.0–0.4)
Eos: 4 %
Hematocrit: 42.9 % (ref 37.5–51.0)
Hemoglobin: 14.2 g/dL (ref 13.0–17.7)
Immature Grans (Abs): 0 10*3/uL (ref 0.0–0.1)
Immature Granulocytes: 0 %
Lymphocytes Absolute: 2.2 10*3/uL (ref 0.7–3.1)
Lymphs: 36 %
MCH: 29.7 pg (ref 26.6–33.0)
MCHC: 33.1 g/dL (ref 31.5–35.7)
MCV: 90 fL (ref 79–97)
Monocytes Absolute: 0.4 10*3/uL (ref 0.1–0.9)
Monocytes: 7 %
Neutrophils Absolute: 3.1 10*3/uL (ref 1.4–7.0)
Neutrophils: 52 %
Platelets: 124 10*3/uL — ABNORMAL LOW (ref 150–450)
RBC: 4.78 x10E6/uL (ref 4.14–5.80)
RDW: 11.8 % (ref 11.6–15.4)
WBC: 6 10*3/uL (ref 3.4–10.8)

## 2020-06-24 LAB — COMPREHENSIVE METABOLIC PANEL
ALT: 21 IU/L (ref 0–44)
AST: 25 IU/L (ref 0–40)
Albumin/Globulin Ratio: 2.1 (ref 1.2–2.2)
Albumin: 4.9 g/dL (ref 4.0–5.0)
Alkaline Phosphatase: 82 IU/L (ref 44–121)
BUN/Creatinine Ratio: 12 (ref 9–20)
BUN: 12 mg/dL (ref 6–20)
Bilirubin Total: <0.2 mg/dL (ref 0.0–1.2)
CO2: 28 mmol/L (ref 20–29)
Calcium: 10 mg/dL (ref 8.7–10.2)
Chloride: 99 mmol/L (ref 96–106)
Creatinine, Ser: 1.03 mg/dL (ref 0.76–1.27)
GFR calc Af Amer: 110 mL/min/{1.73_m2} (ref >59)
GFR calc non Af Amer: 95 mL/min/{1.73_m2} (ref >59)
Globulin, Total: 2.3 g/dL (ref 1.5–4.5)
Glucose: 112 mg/dL — ABNORMAL HIGH (ref 65–99)
Potassium: 4.6 mmol/L (ref 3.5–5.2)
Sodium: 140 mmol/L (ref 134–144)
Total Protein: 7.2 g/dL (ref 6.0–8.5)

## 2020-06-24 LAB — TSH: TSH: 1.38 u[IU]/mL (ref 0.450–4.500)

## 2020-06-24 LAB — LIPID PANEL
Chol/HDL Ratio: 3.3 ratio (ref 0.0–5.0)
Cholesterol, Total: 164 mg/dL (ref 100–199)
HDL: 50 mg/dL (ref >39)
LDL Chol Calc (NIH): 98 mg/dL (ref 0–99)
Triglycerides: 84 mg/dL (ref 0–149)
VLDL Cholesterol Cal: 16 mg/dL (ref 5–40)

## 2020-06-24 LAB — VITAMIN D 25 HYDROXY (VIT D DEFICIENCY, FRACTURES): Vit D, 25-Hydroxy: 28.1 ng/mL — ABNORMAL LOW (ref 30.0–100.0)

## 2020-06-24 LAB — VITAMIN B12: Vitamin B-12: 381 pg/mL (ref 232–1245)

## 2020-06-25 ENCOUNTER — Encounter: Payer: Self-pay | Admitting: Family Medicine

## 2020-06-29 ENCOUNTER — Ambulatory Visit (INDEPENDENT_AMBULATORY_CARE_PROVIDER_SITE_OTHER): Payer: No Payment, Other | Admitting: Licensed Clinical Social Worker

## 2020-06-29 ENCOUNTER — Other Ambulatory Visit: Payer: Self-pay

## 2020-06-29 DIAGNOSIS — F411 Generalized anxiety disorder: Secondary | ICD-10-CM | POA: Diagnosis not present

## 2020-07-01 ENCOUNTER — Other Ambulatory Visit (HOSPITAL_COMMUNITY): Payer: Self-pay | Admitting: Psychiatry

## 2020-07-01 ENCOUNTER — Ambulatory Visit (HOSPITAL_COMMUNITY): Payer: No Payment, Other | Admitting: Licensed Clinical Social Worker

## 2020-07-01 DIAGNOSIS — F411 Generalized anxiety disorder: Secondary | ICD-10-CM

## 2020-07-01 NOTE — Progress Notes (Signed)
   THERAPIST PROGRESS NOTE  Session Time: 45 min  Participation Level: Active  Behavioral Response: CasualAlertEuthymic and Mildly anxious  Type of Therapy: Individual Therapy  Treatment Goals addressed: Anxiety and Coping  Interventions: CBT, Motivational Interviewing and Supportive  Summary: Tyrone White is a 34 y.o. male who presents with hx of GAD/Addiction problem. Pt comes for in person session this date. He presents in an uplifted mood. Pt last seen 11/02 d/t some changes made at [facility] where he resides. Program now only allowing appt once per mon. LCSW assessed for pt's overall status since last session where pt admitted to relapse. Pt states he has not used any drugs, no alcohol and quit smoking cigarettes 2 wks ago. He speaks of how good he feels. Denies cravings. He remains at the entry house they moved him back to and reports he is doing well there. He states he wants to continue to stay there since he is doing so well. LCSW strongly commended pt. Assessed for feelings of anx/paranoia. Pt states he may have some mild anx symptoms at times but he is much improved, particularly with paranoia almost eliminated. Pt able to connect cannabis and paranoia with more insight. Pt reports he did have some sad days around the anniversary of his mother's death 2023-07-05. 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 He is more tuned in to his faith and allowing prayer/faith to help him cope. Addressed self talk and positive distractions for coping. Pt advises he is taking meds as prescribed with exception of Trazodone saying he has nightmares with this med. He reports his Seroquel is helping with sleep and he is happy with sleep pattern at present. Pt denies any new worries concerns. LCSW encouraged pt to continue his very positive path and positive choices. Pt able to say he  is proud of self and has goal of maintaining. LCSW reviewed poc with pt's verbal agreement. Pt states appreciation for care.     Suicidal/Homicidal: Nowithout intent/plan  Therapist Response: Pt remains responsive to care.  Plan: Return again in ~4 weeks.  Diagnosis: Axis I: Generalized Anxiety Disorder    Axis II: Deferred  Brocton Sink, LCSW 07/01/2020

## 2020-07-12 ENCOUNTER — Ambulatory Visit (HOSPITAL_COMMUNITY): Payer: No Payment, Other | Admitting: Licensed Clinical Social Worker

## 2020-08-06 ENCOUNTER — Encounter (HOSPITAL_COMMUNITY): Payer: Self-pay | Admitting: *Deleted

## 2020-08-06 ENCOUNTER — Emergency Department (HOSPITAL_COMMUNITY)
Admission: EM | Admit: 2020-08-06 | Discharge: 2020-08-08 | Disposition: A | Discharge status: Other Acute Inpatient Hospital | Payer: HRSA Program | Attending: Emergency Medicine | Admitting: Emergency Medicine

## 2020-08-06 ENCOUNTER — Other Ambulatory Visit: Payer: Self-pay

## 2020-08-06 ENCOUNTER — Ambulatory Visit (HOSPITAL_COMMUNITY)
Admission: EM | Admit: 2020-08-06 | Discharge: 2020-08-06 | Disposition: A | Discharge status: Nursing Home (Includes ICF) | Payer: Medicaid Other | Attending: Emergency Medicine | Admitting: Emergency Medicine

## 2020-08-06 ENCOUNTER — Encounter (HOSPITAL_COMMUNITY): Payer: Self-pay

## 2020-08-06 DIAGNOSIS — R232 Flushing: Secondary | ICD-10-CM

## 2020-08-06 DIAGNOSIS — Z87891 Personal history of nicotine dependence: Secondary | ICD-10-CM | POA: Diagnosis not present

## 2020-08-06 DIAGNOSIS — F10288 Alcohol dependence with other alcohol-induced disorder: Secondary | ICD-10-CM | POA: Diagnosis not present

## 2020-08-06 DIAGNOSIS — F32A Depression, unspecified: Secondary | ICD-10-CM

## 2020-08-06 DIAGNOSIS — F332 Major depressive disorder, recurrent severe without psychotic features: Secondary | ICD-10-CM | POA: Insufficient documentation

## 2020-08-06 DIAGNOSIS — F1020 Alcohol dependence, uncomplicated: Secondary | ICD-10-CM | POA: Diagnosis present

## 2020-08-06 DIAGNOSIS — U071 COVID-19: Secondary | ICD-10-CM | POA: Insufficient documentation

## 2020-08-06 DIAGNOSIS — R220 Localized swelling, mass and lump, head: Secondary | ICD-10-CM

## 2020-08-06 DIAGNOSIS — R45851 Suicidal ideations: Secondary | ICD-10-CM | POA: Diagnosis not present

## 2020-08-06 DIAGNOSIS — R600 Localized edema: Secondary | ICD-10-CM | POA: Diagnosis present

## 2020-08-06 DIAGNOSIS — G5139 Clonic hemifacial spasm, unspecified: Secondary | ICD-10-CM

## 2020-08-06 DIAGNOSIS — M79672 Pain in left foot: Secondary | ICD-10-CM

## 2020-08-06 DIAGNOSIS — F411 Generalized anxiety disorder: Secondary | ICD-10-CM | POA: Diagnosis not present

## 2020-08-06 DIAGNOSIS — R4781 Slurred speech: Secondary | ICD-10-CM

## 2020-08-06 LAB — CBC
HCT: 43.6 % (ref 39.0–52.0)
Hemoglobin: 13.9 g/dL (ref 13.0–17.0)
MCH: 28.7 pg (ref 26.0–34.0)
MCHC: 31.9 g/dL (ref 30.0–36.0)
MCV: 90.1 fL (ref 80.0–100.0)
Platelets: 153 10*3/uL (ref 150–400)
RBC: 4.84 MIL/uL (ref 4.22–5.81)
RDW: 12.9 % (ref 11.5–15.5)
WBC: 7 10*3/uL (ref 4.0–10.5)
nRBC: 0 % (ref 0.0–0.2)

## 2020-08-06 LAB — BASIC METABOLIC PANEL
Anion gap: 14 (ref 5–15)
BUN: 10 mg/dL (ref 6–20)
CO2: 24 mmol/L (ref 22–32)
Calcium: 10.2 mg/dL (ref 8.9–10.3)
Chloride: 98 mmol/L (ref 98–111)
Creatinine, Ser: 0.87 mg/dL (ref 0.61–1.24)
GFR, Estimated: >60 mL/min (ref >60)
Glucose, Bld: 104 mg/dL — ABNORMAL HIGH (ref 70–99)
Potassium: 3.9 mmol/L (ref 3.5–5.1)
Sodium: 136 mmol/L (ref 135–145)

## 2020-08-06 MED ORDER — DEXAMETHASONE SODIUM PHOSPHATE 10 MG/ML IJ SOLN
10.0000 mg | Freq: Once | INTRAMUSCULAR | Status: AC
  Administered 2020-08-06: 10 mg via INTRAMUSCULAR

## 2020-08-06 MED ORDER — DEXAMETHASONE SODIUM PHOSPHATE 10 MG/ML IJ SOLN
INTRAMUSCULAR | Status: AC
  Filled 2020-08-06: qty 1

## 2020-08-06 NOTE — ED Triage Notes (Signed)
Pt c/o swelling to face onset this morning. States allergic reaction to unknown substance, states he thinks it may be because he took a friend's medicine yesterday "benzopentin" and lorazepam  Airway intact, lungs CTA, mild edema to upper lip and eyes. Vital signs. C/o left foot cramping. Was able to eat/drink PTA, able to speak full sentences  Notified E. Doroteo Glassman, med provider of pt status/arrival. Took Benadryl x2 at approx 1430.  States he had a similar allergic reaction to a "medicine" several years ago.  Medical provider offered pt to go to ED via POV or EMS. Pt opted for Ems transport.

## 2020-08-06 NOTE — ED Triage Notes (Signed)
Pt arrives from [facility] for allergic reaction. Per EMS report, the pt reportedly took Lorazapam and "another green pill" of someone else's last night around 2300. This morning, woke up with eye swelling, jaw pain, muscle cramping. He took benadryl today and was given decadron IM by UC without relief. Pt hypertensive by ems, 210/140.

## 2020-08-06 NOTE — ED Triage Notes (Signed)
Pt says b/w 8-10pm last night he took two 0.5mg  lorazepam and 2mg  of a green pill that started with "benzo". Tightness and cramping in jaw, tongue and feet when he awoke this morning, worsened through the day. Feels like he has facial swelling. Not better with benadryl. No acute distress. No allergies that he is aware of.

## 2020-08-06 NOTE — ED Notes (Signed)
Pt placed on BS cardiac monitoring via [employer].

## 2020-08-06 NOTE — ED Notes (Signed)
Pt complained of feeling short of breath. This tech rechecked his vital signs and ensured him that his oxygen was at 98% RA, and that he is okay. Pt is requesting tylenol. Kerrie Pleasure, RN was notified and went to go talk to this patient.

## 2020-08-06 NOTE — ED Notes (Signed)
Patient is being discharged from the Urgent Care and sent to the Emergency Department via EMS. Per E. Doroteo Glassman, [place] patient is in need of higher level of care due to allergic reaction. Patient is aware and verbalizes understanding of plan of care.  Vitals:   08/06/20 1840  BP: (!) 177/93  Pulse: (!) 103  Resp: 18  Temp: 98.3 F (36.8 C)  SpO2: 97%

## 2020-08-06 NOTE — ED Provider Notes (Signed)
[facility]    CSN: [id number] Arrival date & time: 08/06/20  1815      History   Chief Complaint Chief Complaint  Patient presents with  . Allergic Reaction    HPI Tyrone White is a 35 y.o. male.   HPI Tyrone White is a 35 y.o. male presenting to UC with c/o mild swelling to his face and flushing of his face and left foot cramping and pain that started this morning.  He is concerned he is having an allergic reaction to an unknown substance.  He is staying at the [facility] in [place] and took a friend's medication "benzopentin" and lorazepam.  Denies difficulty breathing or swallowing but reports having trouble speaking at times, he feels his face is having muscle spasms.  He reports being admitted in [facility] for an allergic reaction a few years ago but is unsure what he had an allergic reaction to. He is unsure if he has ever had an EpiPen before.    He took two benadryl around 1430 this afternoon.  He reports feeling tired but otherwise no improvement of muscle spasms.    Past Medical History:  Diagnosis Date  . Anxiety   . 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 Depression     Patient Active Problem List   Diagnosis Date Noted  . Generalized anxiety disorder 01/29/2020  . 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 Left lower quadrant abdominal pain 01/29/2020    History reviewed. No pertinent surgical history.     Home Medications    Prior to Admission medications   Medication Sig Start Date End Date Taking? Authorizing Provider  busPIRone (BUSPAR) 10 MG tablet Take 1 tablet (10 mg total) by mouth 3 (three) times daily. 06/09/20   Shanna Cisco, NP  hydrOXYzine (ATARAX/VISTARIL) 25 MG tablet TAKE 1 TABLET BY MOUTH THREE TIMES A DAY AS NEEDED 07/01/20   Toy Cookey E, NP  ibuprofen (ADVIL) 800 MG tablet TAKE 1 TABLET (800 MG TOTAL) BY MOUTH EVERY 8  (EIGHT) HOURS AS NEEDED. 07/02/20   Kallie Locks, FNP  QUEtiapine (SEROQUEL) 200 MG tablet Take 1 tablet (200 mg total) by mouth at bedtime. 06/09/20   Shanna Cisco, NP  traZODone (DESYREL) 50 MG tablet Take 1 tablet (50 mg total) by mouth at bedtime as needed for sleep. Patient not taking: Reported on 06/23/2020 06/09/20   Shanna Cisco, NP    Family History Family History  Problem Relation Age of Onset  . Heart disease Mother   . Hypertension Mother   . Diabetes Maternal Uncle   . Prostate cancer Father     Social History Social History   Tobacco Use  . Smoking status: Former Smoker    Types: Cigarettes  . Smokeless tobacco: Never Used  . Tobacco comment: "state that he smokes 3 cigarettes a day"  Vaping Use  . Vaping Use: Never used  Substance Use Topics  . Alcohol use: Not Currently  . Drug use: Not Currently     Allergies   Patient has no known allergies.   Review of Systems Review of Systems  Constitutional: Negative for chills and fever.  HENT: Positive for facial swelling. Negative for congestion, ear pain, sore throat, trouble swallowing and voice change.   Respiratory: Negative for cough and shortness of breath.   Cardiovascular: Negative for chest pain and palpitations.  Gastrointestinal: Negative for abdominal  pain, diarrhea, nausea and vomiting.  Musculoskeletal: Positive for myalgias. Negative for arthralgias and back pain.  Skin: Positive for rash.  All other systems reviewed and are negative.    Physical Exam Triage Vital Signs ED Triage Vitals [08/06/20 1840]  Enc Vitals Group     BP (!) 177/93     Pulse Rate (!) 103     Resp 18     Temp 98.3 F (36.8 C)     Temp Source Oral     SpO2 97 %     Weight      Height      Head Circumference      Peak Flow      Pain Score      Pain Loc      Pain Edu?      Excl. in GC?    No data found.  Updated Vital Signs BP (!) 177/93 (BP Location: Left Arm)   Pulse (!) 103   Temp  98.3 F (36.8 C) (Oral)   Resp 18   SpO2 97%   Visual Acuity Right Eye Distance:   Left Eye Distance:   Bilateral Distance:    Right Eye Near:   Left Eye Near:    Bilateral Near:     Physical Exam Vitals and nursing note reviewed.  Constitutional:      General: He is not in acute distress.    Appearance: Normal appearance. He is well-developed and well-nourished. He is not ill-appearing.  HENT:     Head: Normocephalic and atraumatic.      Right Ear: Tympanic membrane and ear canal normal.     Left Ear: Tympanic membrane and ear canal normal.     Nose: Nose normal.     Mouth/Throat:     Lips: Pink.     Mouth: Mucous membranes are moist.     Pharynx: Oropharynx is clear. Uvula midline. No pharyngeal swelling or uvula swelling.  Eyes:     Extraocular Movements: EOM normal.  Cardiovascular:     Rate and Rhythm: Normal rate and regular rhythm.  Pulmonary:     Effort: Pulmonary effort is normal. No respiratory distress.     Breath sounds: Normal breath sounds. No stridor. No wheezing, rhonchi or rales.     Comments: No stridor but intermittent slurred/muffled speech. Able to speak in full sentences without difficulty breathing. Musculoskeletal:        General: Normal range of motion.     Cervical back: Normal range of motion and neck supple. No rigidity.  Skin:    General: Skin is warm and dry.     Capillary Refill: Capillary refill takes less than 2 seconds.  Neurological:     Mental Status: He is alert and oriented to person, place, and time.     Sensory: No sensory deficit.     Motor: No weakness.     Coordination: Coordination normal.     Gait: Gait normal.     Comments: Intermittent slurred speech. Alert to person, place and time. Able to follow 2 step commands. Normal thought process.   Psychiatric:        Mood and Affect: Mood and affect normal.        Behavior: Behavior normal.      UC Treatments / Results  Labs (all labs ordered are listed, but only  abnormal results are displayed) Labs Reviewed - No data to display  EKG   Radiology No results found.  Procedures Procedures (including critical care  time)  Medications Ordered in UC Medications  dexamethasone (DECADRON) injection 10 mg (10 mg Intramuscular Given 08/06/20 1855)    Initial Impression / Assessment and Plan / UC Course  I have reviewed the triage vital signs and the nursing notes.  Pertinent labs & imaging results that were available during my care of the patient were reviewed by me and considered in my medical decision making (see chart for details).     DDx: allergic reaction, side effects of unknwon substance taken last night, CVA Recommend further evaluation in emergency department  Pt understanding and agreeable with plan. Decadron 10mg  IM given prior to discharge while waiting on EMS to help with facial swelling. No respiratory distress, no definite anaphylaxis at this time, no Epi given.  EMS assumed care of pt to transport to [facility], pt discharged in stable condition.   Final Clinical Impressions(s) / UC Diagnoses   Final diagnoses:  Facial swelling  Facial flushing  Slurred speech  Left foot pain  Facial spasm   Discharge Instructions   None    ED Prescriptions    None     PDMP not reviewed this encounter.   [facility], Lurene Shadow 08/06/20 1920

## 2020-08-06 NOTE — ED Notes (Signed)
EMS called for transport to ED for allergic reaction.

## 2020-08-07 DIAGNOSIS — F332 Major depressive disorder, recurrent severe without psychotic features: Secondary | ICD-10-CM | POA: Insufficient documentation

## 2020-08-07 DIAGNOSIS — F1020 Alcohol dependence, uncomplicated: Secondary | ICD-10-CM | POA: Diagnosis present

## 2020-08-07 DIAGNOSIS — R45851 Suicidal ideations: Secondary | ICD-10-CM

## 2020-08-07 LAB — RESP PANEL BY RT-PCR (FLU A&B, COVID) ARPGX2
Influenza A by PCR: NEGATIVE
Influenza B by PCR: NEGATIVE
SARS Coronavirus 2 by RT PCR: POSITIVE — AB

## 2020-08-07 LAB — RAPID URINE DRUG SCREEN, HOSP PERFORMED
Amphetamines: NOT DETECTED
Barbiturates: NOT DETECTED
Benzodiazepines: NOT DETECTED
Cocaine: NOT DETECTED
Opiates: NOT DETECTED
Tetrahydrocannabinol: NOT DETECTED

## 2020-08-07 LAB — ETHANOL: Alcohol, Ethyl (B): <10 mg/dL (ref <10)

## 2020-08-07 MED ORDER — QUETIAPINE FUMARATE 200 MG PO TABS
200.0000 mg | ORAL_TABLET | Freq: Every day | ORAL | Status: DC
  Administered 2020-08-08: 200 mg via ORAL
  Filled 2020-08-07: qty 1

## 2020-08-07 MED ORDER — HYDROXYZINE HCL 25 MG PO TABS
25.0000 mg | ORAL_TABLET | Freq: Once | ORAL | Status: AC
  Administered 2020-08-07: 25 mg via ORAL
  Filled 2020-08-07: qty 1

## 2020-08-07 MED ORDER — BUSPIRONE HCL 10 MG PO TABS
10.0000 mg | ORAL_TABLET | Freq: Three times a day (TID) | ORAL | Status: DC
Start: 2020-08-07 — End: 2020-08-08
  Administered 2020-08-07 – 2020-08-08 (×3): 10 mg via ORAL
  Filled 2020-08-07 (×3): qty 1

## 2020-08-07 MED ORDER — TRAZODONE HCL 50 MG PO TABS
50.0000 mg | ORAL_TABLET | Freq: Every evening | ORAL | Status: DC | PRN
  Filled 2020-08-07: qty 1

## 2020-08-07 NOTE — BH Assessment (Signed)
Comprehensive Clinical Assessment (CCA) Note  08/07/2020 Tyrone White [id number]   Patient presents to the [facility] after having ingested a pill from a friend trying to self-medicate his anxiety.  Patient had a medical reaction to the drug and had to come to the ED.  Patient states that he has been in the [facility] for the past year and doing relatively well.  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  However, over the past couple months he states that he has been slipping up and states that he has been drinking 6-7 beers daily.  Patient states that he is unhappy with his job assignment through the [employer] and this has been a trigger for his use of alcohol.  Patient states that he has not been sleeping well, he has been having racing thoughts, he is becoming more depressed and states that his anxiety has been off the chart.  Patient states that things got so bad yesterday that he took a benzodiazepine pill to try to settle down, but he states that he had a reaction to it and he states that he had to come to the ED.  Patient states that he continues to "not feel right" and states that he is very restless and feeling unsettled to the point that he has been having suicidal thoughts.  Initially, patient stated, "I don't want to kill myself, I just feel like giving up." Patient was inormed that he most likely would not meet admission criteria for inpatient. Now patient is saying that if he is discharged from the ED that he will kill himself.   Patient states that he has never attempted suicide in the past, however, he does states that he has been on two inpatient psychiatric units in the past when he was very depressed.  Patient denies HI and psychosis.  Patient presents as oriented and alert.  His mood is depressed and patient is moderately anxious.  His judgment, insight and impulse control are impaired.  His thoughts are organized and his memory is intact.  Patient is very  restless.  Chief Complaint:  Chief Complaint  Patient presents with  . Allergic Reaction  . Suicidal  . Depression  . Anxiety   Visit Diagnosis: F33.2 MDD Recurrent Severe without Psychosis, F10.20 Alcohol Use Disorder Severe   CCA Screening, Triage and Referral (STR)  Patient Reported Information How did you hear about [place]? Other (Comment)  Referral name: [facility]  Referral phone number: No data recorded  Whom do you see for routine medical problems? I don't have a doctor  Practice/Facility Name: No data recorded Practice/Facility Phone Number: No data recorded Name of Contact: No data recorded Contact Number: No data recorded Contact Fax Number: No data recorded Prescriber Name: No data recorded Prescriber Address (if known): No data recorded  What Is the Reason for Your Visit/Call Today? Patient states that he is depressed and has lots of anxiety.  He states that he took someone else's pills and had a medical reaction  How Long Has This Been Causing You Problems? 1-6 months  What Do You Feel Would Help You the Most Today? -- (Patient is requesting inpatient treatment)   Have You Recently Been in Any Inpatient Treatment (Hospital/Detox/Crisis Center/28-Day Program)? No  Name/Location of Program/Hospital:No data recorded How Long Were You There? No data recorded When Were You Discharged? No data recorded  Have You Ever Received Services From [facility] Before? Yes  Who Do You See at [facility]  Health? Patient has been seen at Urgent Care in the past   Have You Recently Had Any Thoughts About Hurting Yourself? Yes  Are You Planning to Commit Suicide/Harm Yourself At This time? No   Have you Recently Had Thoughts About Hurting Someone Karolee Ohslse? No  Explanation: No data recorded  Have You Used Any Alcohol or Drugs in the Past 24 Hours? No  How Long Ago Did You Use Drugs or Alcohol? 0000 (2 days ago)  What Did You Use and How Much? 6-7 beers   Do You  Currently Have a Therapist/Psychiatrist? No  Name of Therapist/Psychiatrist: No data recorded  Have You Been Recently Discharged From Any Office Practice or Programs? No  Explanation of Discharge From Practice/Program: No data recorded    CCA Screening Triage Referral Assessment Type of Contact: Tele-Assessment  Is this Initial or Reassessment? Initial Assessment  Date Telepsych consult ordered in [facility]:  08/07/2020  Time Telepsych consult [facility]:  0829   Patient Reported Information Reviewed? Yes  Patient Left Without Being Seen? No data recorded Reason for Not Completing Assessment: No data recorded  Collateral Involvement: No collateral available   Does Patient Have a Court Appointed Legal Guardian? No data recorded Name and Contact of Legal Guardian: No data recorded If Minor and Not Living with Parent(s), Who has Custody? No data recorded Is CPS involved or ever been involved? Never  Is APS involved or ever been involved? Never   Patient Determined To Be At Risk for Harm To Self or Others Based on Review of Patient Reported Information or Presenting Complaint? Yes, for Self-Harm (patient has no plan or intent)  Method: No data recorded Availability of Means: No data recorded Intent: No data recorded Notification Required: No data recorded Additional Information for Danger to Others Potential: No data recorded Additional Comments for Danger to Others Potential: No data recorded Are There Guns or Other Weapons in Your Home? No data recorded Types of Guns/Weapons: No data recorded Are These Weapons Safely Secured?                            No data recorded Who Could Verify You Are Able To Have These Secured: No data recorded Do You Have any Outstanding Charges, Pending Court Dates, Parole/Probation? No data recorded Contacted To Inform of Risk of Harm To Self or Others: Unable to Contact:   [facility]   Does Patient Present under  Involuntary Commitment? No  IVC Papers Initial File Date: No data recorded  [place]   Patient Currently Receiving the Following Services: Medication Management ([facility])   Determination of Need: Urgent (48 hours)   Options For Referral: Other: Comment ([facility])     CCA Biopsychosocial Intake/Chief Complaint:  [facility] having ingested a pill from a friend trying to self-medicate his anxiety.  Patient had a medical reaction to the drug and had to come to the ED.  Patient states that he has [facility] for the past year and doing relatively well.  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  However, over the past couple months he states that he has been slipping up and states that he has been drinking 6-7 beers daily.  Patient states that he is unhappy with his job assignment through the [facility] and this  has been a trigger for his use of alcohol.  Patient states that he has not been sleeping well, he has been having racing thoughts, he is becoming more depressed and states that his anxiety has been off the chart.  Patient states that things got so bad yesterday that he took a benzodiazepine pill to try to settle down, but he states that he had a reaction to it and he states that he had to come to the ED.  Patient states that he continues to "not feel right" and states that he is very restless and feeling unsettled to the point that he has been having suicidal thoughts.  Initially, patient stated, "I don't want to kill myself, I just feel like giving up." Patient was inormed that he most likely would not meet admission criteria for inpatient. Now patient is saying that if he is discharged from the ED that he will kill himself.   Patient states that he has never attempted suicide in the past, however, he does states that he has been on two  inpatient psychiatric units in the past when he was very depressed.  Patient denies HI and psychosis.  Current Symptoms/Problems: paranoia, always feeling like people are talking about him, poor focus, poor sleep, poor self-esteem   Patient Reported Schizophrenia/Schizoaffective Diagnosis in Past: No   Strengths: Receptive to help, states that he likes to help others  Preferences: Wants to be called "Tyrone White".  Abilities: Patient states that he is a good motivator   Type of Services Patient Feels are Needed: Patient states that he would like to be in an inpatient facility   Initial Clinical Notes/Concerns: LCSW reviewed informed consent with pt verbal statement of understanding. Pt [facility] where he is not totally comforable but plans to remain for now. He was homeless ~ 2 mon. prior to that and living with an Aunt prior to that.  He feels he is disliked at [facility] and people are talking about him. Reports hearing voices at times that sound like people talking about him. 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 He denies any intent for self harm and states he does not hear any command voices. Believes he first heard voices at ~ age of 35. Pt lost his mother in 2016 to heart disease. He does not feel he got the support he needed to process loss at that time. Pt would like help developing coping skills to address feelings of anxiety and paranoia so he does not use substances to cope. Pt considering applying for disability.   Mental Health Symptoms Depression:  Difficulty Concentrating; Hopelessness; Sleep (too much or little); Worthlessness   Duration of Depressive symptoms: Greater than two weeks   Mania:  None   Anxiety:   Difficulty concentrating; Irritability; Restlessness; Worrying; Tension; Sleep   Psychosis:  None   Duration of Psychotic symptoms: No data recorded  Trauma:  None   Obsessions:   Recurrent & persistent thoughts/impulses/images   Compulsions:  No data recorded  Inattention:  None   Hyperactivity/Impulsivity:  Feeling of restlessness   Oppositional/Defiant Behaviors:  None   Emotional Irregularity:  Transient, stress-related paranoia/disassociation; Unstable self-image   Other Mood/Personality Symptoms:  No data recorded   Mental Status Exam Appearance and self-care  Stature:  Tall   Weight:  Thin   Clothing:  Casual   Grooming:  Normal   Cosmetic use:  None   Posture/gait:  Tense   Motor activity:  Restless   Sensorium  Attention:  Normal   Concentration:  -- (Reports poor focus)   Orientation:  Object; Person; Place; Situation; Time   Recall/memory:  Normal   Affect and Mood  Affect:  Depressed; Anxious   Mood:  Anxious   Relating  Eye contact:  Normal   Facial expression:  Anxious; Depressed   Attitude toward examiner:  Cooperative   Thought and Language  Speech flow: Normal   Thought content:  Appropriate to Mood and Circumstances   Preoccupation:  Ruminations (concerning his anxiety and restlessness)   Hallucinations:  None   Organization:  No data recorded  [employer] of Knowledge:  Average   Intelligence:  Average   Abstraction:  Normal   Judgement:  Impaired   Reality Testing:  Adequate   Insight:  Gaps   Decision Making:  Impulsive   Social Functioning  Social Maturity:  Impulsive   Social Judgement:  "Street Smart"   Stress  Stressors:  Grief/losses; Housing; Office manager Ability:  Deficient supports   Skill Deficits:  Self-control   Supports:  Friends/Service system; Support needed     Religion: Religion/Spirituality Are You A Religious Person?: Yes What is Your Religious Affiliation?: Christian  Leisure/Recreation: Leisure / Recreation Do You Have Hobbies?: Yes Leisure and Hobbies: Reading  Exercise/Diet: Exercise/Diet Do You Exercise?: No Have You Gained or  Lost A Significant Amount of Weight in the Past Six Months?: No Do You Follow a Special Diet?: No Do You Have Any Trouble Sleeping?: Yes Explanation of Sleeping Difficulties: Trouble staying asleep, sleeps four hours per night   CCA Employment/Education Employment/Work Situation: Employment / Work Psychologist, occupational Employment situation: 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[employer], 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) What is the longest time patient has a held a job?: 2 yrs Where was the patient employed at that time?: [place] Has patient ever been in the [employer]?: No  Education: Education Is Patient Currently Attending School?: No Last Grade Completed: 10 Name of High School: [employer] Did You Graduate From [employer]?: No Did Theme park manager?: Yes What Type of 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?: Advice worker Program Did [place] Attend Graduate School?: No Did You Have An Individualized Education Program (IIEP): No Did You Have Any Difficulty At [employer]?: No Patient's Education Has Been Impacted by Current Illness: No   CCA Family/Childhood History Family and Relationship History: Family history Marital status: Single Are you sexually active?: No What is your sexual orientation?: Heterosexual Does patient have children?: No  Childhood History:  Childhood History By whom was/is the patient raised?: Mother Additional childhood history information: patient does not have a good relationship with his father Description of patient's relationship with caregiver when they were a child: Patient states that he was raised by his mother and states that he was close to her Patient's description of current relationship with people who raised him/her: Mother died in Nov 03, 2014 Does patient have siblings?: Yes Number of Siblings: 6 Description of patient's current relationship with siblings: No contact Did patient suffer any verbal/emotional/physical/sexual abuse as a child?: No Did patient suffer from severe  childhood neglect?: No Has patient ever been sexually abused/assaulted/raped as an adolescent or adult?: No Was the patient ever a victim of a crime or a disaster?: No Witnessed domestic violence?: No Has patient been affected by domestic violence as an adult?: No  Child/Adolescent Assessment:     CCA Substance Use Alcohol/Drug Use: Alcohol / Drug Use  Pain Medications: See chart Prescriptions: See chart Over the Counter: See chart History of alcohol / drug use?: Yes Negative Consequences of Use: Personal relationships,Financial,Legal Substance #1 Name of Substance 1: alcohol 1 - Age of First Use: unknown 1 - Amount (size/oz): 6-7 beers 1 - Frequency: daily 1 - Duration: past couple months 1 - Last Use / Amount: 2 days ago     ASAM's:  Six Dimensions of Multidimensional Assessment  Dimension 1:  Acute Intoxication and/or Withdrawal Potential:   Dimension 1:  Description of individual's past and current experiences of substance use and withdrawal: Patient is drinking daily, but denies having any alcohol withdrawal symptoms  Dimension 2:  Biomedical Conditions and Complications:   Dimension 2:  Description of patient's biomedical conditions and  complications: Patient has no medical conditions exacerbated by his use of alcohol  Dimension 3:  Emotional, Behavioral, or Cognitive Conditions and Complications:  Dimension 3:  Description of emotional, behavioral, or cognitive conditions and complications: Patient states that he drinks because of his anxiety  Dimension 4:  Readiness to Change:  Dimension 4:  Description of Readiness to Change criteria: Patient states that he is ready to make changes in his life  Dimension 5:  Relapse, Continued use, or Continued Problem Potential:  Dimension 5:  Relapse, continued use, or continued problem potential critiera description: Patient has a long history of use with multiple relapses  Dimension 6:  Recovery/Living Environment:  Dimension 6:   Recovery/Iiving environment criteria description: Patient has been drinking while in a treatment recovery program and will most likely be discharged from the program  ASAM Severity Score: ASAM's Severity Rating Score: 10  ASAM Recommended Level of Treatment: ASAM Recommended Level of Treatment: Level III Residential Treatment   Substance use Disorder (SUD) Substance Use Disorder (SUD)  Checklist Symptoms of Substance Use: Continued use despite having a persistent/recurrent physical/psychological problem caused/exacerbated by use,Continued use despite persistent or recurrent social, interpersonal problems, caused or exacerbated by use,Persistent desire or unsuccessful efforts to cut down or control use,Presence of craving or strong urge to use,Recurrent use that results in a failure to fulfill major role obligations (work, school, home),Social, occupational, recreational activities given up or reduced due to use,Substance(s) often taken in larger amounts or over longer times than was intended  Recommendations for Services/Supports/Treatments: Recommendations for Services/Supports/Treatments Recommendations For Services/Supports/Treatments: Residential-Level 3  DSM5 Diagnoses: Patient Active Problem List   Diagnosis Date Noted  . Severe episode of recurrent major depressive disorder, without psychotic features (HCC)   . Alcohol use disorder, severe, dependence (HCC)   . Generalized anxiety disorder 01/29/2020  . 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 Left lower quadrant abdominal pain 01/29/2020    Disposition:  Per Maxie Barb, NP, patient is recommended for inpatient treatment  Referrals to Alternative Service(s): Referred to Alternative Service(s):   Place:   Date:   Time:    Referred to Alternative Service(s):   Place:   Date:   Time:    Referred to Alternative Service(s):   Place:   Date:   Time:    Referred to Alternative Service(s):   Place:    Date:   Time:     Square Jowett J Efe Fazzino, LCAS

## 2020-08-07 NOTE — Care Management (Signed)
Writer referred patient to the following facilities:   [facility] Details  Fax        [address], [place] [address]    Internal comment    [facility] Details  Fax        [address], [place] [address]    Internal comment    [facility] Details  Fax        [address], [place] [address]    Internal comment    [facility] Details  [address] [address] [place] [address]    Internal comment    [facility] Details  Fax        [phone], [place] [address]    Internal comment    [facility] Details  Fax        [address], [place] [address]    Internal comment    [facility] Details  Fax        [address] [place], [place] [address]    Internal comment    [facility] Details  Fax        [phone] [address] [place] [address]    Internal comment    [facility] Details  Fax        [address], [place] [address]    Internal comment    [facility] Details  Fax        [phone]. [address], [place] [address]    Internal comment    [facility] Details  Fax        [address] [place] [address]    Internal comment    [facility] Details  Fax        [address] [place] [address]    Internal comment    [facility] Details  Fax        [address] [place] [address]    Internal comment    [facility] Details  Fax        [address] [place] [address]    Internal comment    [facility] Details  Fax        [address] [place] [address]    Internal comment    [facility] Details  Fax        [address] [place] [address]    Internal comment    [facility]  Details  Fax        [address], [place] [address]    Internal comment    [facility] Details  Fax        [address] [place] [address]    Internal comment    [facility] Details  Fax        [address] [place], [place] [address]    Internal comment    [facility] Details  Fax        [address] [place] [address]    Internal comment

## 2020-08-07 NOTE — Discharge Instructions (Signed)
      Psychiatry and Counseling   Psychiatrists   [facility]   [facility]   [address]    [address]   [place], [place] [address]    [place], [place] [address]   [phone]      [phone]      Dr. Archer Asa     [facility]   [address]    [address] [place] [address]    [place] [address]   [phone]      [phone]      Andee Poles, MD    [facility]   [address]    [address]   [place] [address]    [place] [address]   [phone]       [phone]      Therapists   [facility]    [facility]   [address] [place], [place]   [phone]      [phone]      [facility]   [address]     [address] [place] [address]    [place], [place]   [phone]      [phone]       [facility]   [facility]   [address]    [address]   [place], [place] [address]    [place], [place] [address]   [phone]      [phone]      [facility]   [facility]   [address]    [address]   [place], [place] [address]    [place], [place] [address]   [phone]      [phone]      These referrals have been provided to you as appropriate for your clinical needs while taking into account your financial concerns. Please be aware that agencies, practitioners and insurance companies sometimes change contracts. When calling to make an appointment have your insurance information available so the professional you are going to see can confirm whether they are covered by your plan. Take this form with you in case the person you  are seeing needs a copy or to contact us.      ___________________________________________   Clinical Social Worker

## 2020-08-07 NOTE — ED Provider Notes (Signed)
[facility] EMERGENCY DEPARTMENT Provider Note  CSN: [id number] Arrival date & time: 08/06/20 1923    History Chief Complaint  Patient presents with  . Allergic Reaction  . Suicidal  . Depression  . Anxiety    HPI  Tyrone White is a 35 y.o. male who presents from local UC for evaluation of possible allergic reaction. He reports he took 'a friend's' Lorazepam and Klonopin yesterday. He woke up with cramping in his face and feet. He went to [facility] and they were concerned about face swelling. He never had tongue swelling or difficulty breathing. He is living at [facility] for substance abuse treatment, previously used meth and pain pills. He took the benzos to get high and because he was depressed. He reports this was an attempt at self-harm. He continues to have thoughts of self-harm and is depressed.    Past Medical History:  Diagnosis Date  . Anxiety   . 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 Depression     History reviewed. No pertinent surgical history.  Family History  Problem Relation Age of Onset  . Heart disease Mother   . Hypertension Mother   . Diabetes Maternal Uncle   . Prostate cancer Father     Social History   Tobacco Use  . Smoking status: Former Smoker    Types: Cigarettes  . Smokeless tobacco: Never Used  . Tobacco comment: "state that he smokes 3 cigarettes a day"  Vaping Use  . Vaping Use: Never used  Substance Use Topics  . Alcohol use: Not Currently  . Drug use: Not Currently     Home Medications Prior to Admission medications   Medication Sig Start Date End Date Taking? Authorizing Provider  busPIRone (BUSPAR) 10 MG tablet Take 1 tablet (10 mg total) by mouth 3 (three) times daily. 06/09/20   Shanna Cisco, NP  hydrOXYzine (ATARAX/VISTARIL) 25 MG tablet TAKE 1 TABLET BY MOUTH THREE TIMES A DAY AS NEEDED 07/01/20   Toy Cookey E, NP  ibuprofen (ADVIL) 800 MG tablet TAKE 1 TABLET  (800 MG TOTAL) BY MOUTH EVERY 8 (EIGHT) HOURS AS NEEDED. 07/02/20   Kallie Locks, FNP  QUEtiapine (SEROQUEL) 200 MG tablet Take 1 tablet (200 mg total) by mouth at bedtime. 06/09/20   Shanna Cisco, NP  traZODone (DESYREL) 50 MG tablet Take 1 tablet (50 mg total) by mouth at bedtime as needed for sleep. Patient not taking: Reported on 06/23/2020 06/09/20   Shanna Cisco, NP     Allergies    Patient has no known allergies.   Review of Systems   Review of Systems A comprehensive review of systems was completed and negative except as noted in HPI.    Physical Exam BP 130/89   Pulse 91   Temp 98.1 F (36.7 C) (Oral)   Resp 16   SpO2 97%   Physical Exam Vitals and nursing note reviewed.  Constitutional:      Appearance: Normal appearance.  HENT:     Head: Normocephalic and atraumatic.     Nose: Nose normal.     Mouth/Throat:     Mouth: Mucous membranes are moist.  Eyes:     Extraocular Movements: Extraocular movements intact.     Conjunctiva/sclera: Conjunctivae normal.  Cardiovascular:     Rate and Rhythm: Normal rate.  Pulmonary:     Effort: Pulmonary effort is normal.     Breath sounds: Normal breath  sounds.  Abdominal:     General: Abdomen is flat.     Palpations: Abdomen is soft.     Tenderness: There is no abdominal tenderness.  Musculoskeletal:        General: No swelling. Normal range of motion.     Cervical back: Neck supple.  Skin:    General: Skin is warm and dry.  Neurological:     General: No focal deficit present.     Mental Status: He is alert.  Psychiatric:     Comments: Flat affect      ED Results / Procedures / Treatments   Labs (all labs ordered are listed, but only abnormal results are displayed) Labs Reviewed  RESP PANEL BY RT-PCR (FLU A&B, COVID) ARPGX2 - Abnormal; Notable for the following components:      Result Value   SARS Coronavirus 2 by RT PCR POSITIVE (*)    All other components within normal limits  BASIC  METABOLIC PANEL - Abnormal; Notable for the following components:   Glucose, Bld 104 (*)    All other components within normal limits  CBC  ETHANOL  RAPID URINE DRUG SCREEN, HOSP PERFORMED    EKG None  Radiology No results found.  Procedures Procedures  Medications Ordered in the ED Medications  QUEtiapine (SEROQUEL) tablet 200 mg (has no administration in time range)  busPIRone (BUSPAR) tablet 10 mg (10 mg Oral Given 08/07/20 1203)  traZODone (DESYREL) tablet 50 mg (has no administration in time range)     MDM Rules/Calculators/A&P MDM Exam is normal, no signs of allergic reaction. He was given Decadron at [facility] yesterday afternoon. He is currently expressing SI. Will add labs to complete medical clearance and consult TTS. Patient is cooperative and voluntary at this time.  ED Course  I have reviewed the triage vital signs and the nursing notes.  Pertinent labs & imaging results that were available during my care of the patient were reviewed by me and considered in my medical decision making (see chart for details).  Clinical Course as of 08/07/20 1411  Sat Aug 07, 2020  1019 Patient is medically clear for TTS [CS]  1044 Patient seen by TTS/Psych and cleared for discharge per Secure Chat message from Maxie Barb, NP. Will arrange for return to [place].  [CS]  1049 Patient requesting benadryl for face swelling. He has not swelling of his face or tongue. He states he still feels like his jaw is cramping. No indication for any additional meds at this time. He states he was told by TTS that he would be going to [facility] Requesting clarification from Psych about his disposition.  [CS]  1115 Patient's Covid test is positive. He is having mild symptoms and no hypoxia. No additional treatment indicated. He was asked to keep his mask on while he is in a hallway bed. He indicates to me that he is still feeling suicidal.He had been assessed by TTS but not by one of the [facility]  providers.  I have asked for a formal provider consult to be done prior to dispo. I am unsure if [facility] will accept him back with positive Covid test. There was no answer when SW attempted to contact them.  [CS]  1119 Per Ms. Leevy-Johnson, NP they will plan for [facility] admission on their Covid unit.  [CS]    Clinical Course User Index [CS] Pollyann Savoy, MD    Final Clinical Impression(s) / ED Diagnoses Final diagnoses:  Depression, unspecified depression type  Suicidal ideations    Rx / DC Orders ED Discharge Orders    None       Pollyann Savoy, MD 08/07/20 [phone]

## 2020-08-07 NOTE — ED Notes (Signed)
Pt belongings inventoried by this NT and 1 other. Pt belongings placed in [facility]1.

## 2020-08-07 NOTE — Consult Note (Signed)
Telepsych Consultation   Reason for Consult:  Psych consult Referring Physician:  Susy Frizzleharles Sheldon, MD Location of Patient: [facility] [phone] [facility] of Provider[facility]  Patient Identification: Tyrone White MRN:  [id number] Principal Diagnosis: Suicidal ideations Diagnosis:  Principal Problem:   Suicidal ideations Active Problems:   Generalized anxiety disorder   Alcohol use disorder, severe, dependence (HCC)  Total Time spent with patient: 15 minutes  Subjective:   Tyrone White is a 35 y.o. male patient admitted with allergic reaction after ingesting an unknown pill, patient then began to endorse suicidal ideations.  "I came because I had an allergic reaction. My anxiety has been through the roof and I've been going through some things".   Patient recently living in [facility]-based treatment recovery [facility] states he is not happy there and does not feel it's helping. Endorses past history of substance abuse: methamphetamines, marijuana, alcohol; recently relapsed with alcohol and "pills".Patient states people are bringing beer into the program which is affecting his sobriety. Patient states he hasn't had any of his psychotropic medication in two days. [facility].  Patient is currently endorsing intense anxiety and active suicidal ideations with no plan. Patient denies any homicidal ideations, auditory/visual hallucinations, and does not appear to be responding to any external/internal stimuli at this time.   HPI:   Tyrone White is a 35 year old male patient admitted with an allergic reaction after ingesting an unknown pill, patient then began to endorse suicidal ideations. Patient has past psychiatric history of GAD, polysubstance use and [facility]. [facility] polys[facility] abuse [facility].   Past  Psychiatric History:  GAD Polysubstance use  Risk to Self:  yes Risk to Others:  no Prior Inpatient Therapy:  no Prior Outpatient Therapy:  yes  Past Medical History:  Past Medical History:  Diagnosis Date  . Anxiety   . 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 Depression    History reviewed. No pertinent surgical history. Family History:  Family History  Problem Relation Age of Onset  . Heart disease Mother   . Hypertension Mother   . Diabetes Maternal Uncle   . Prostate cancer Father    Family Psychiatric  History: not noted Social History:  Social History   Substance and Sexual Activity  Alcohol Use Not Currently     Social History   Substance and Sexual Activity  Drug Use Not Currently    Social History   Socioeconomic History  . Marital status: Single    Spouse name: Not on file  . Number of children: Not on file  . Years of education: Not on file  . Highest education level: Not on file  Occupational History  . Not on file  Tobacco Use  . Smoking status: Former Smoker    Types: Cigarettes  . Smokeless tobacco: Never Used  . Tobacco comment: "state that he smokes 3 cigarettes a day"  Vaping Use  . Vaping Use: Never used  Substance and Sexual Activity  . Alcohol use: Not Currently  . Drug use: Not Currently  . Sexual activity: Not Currently  Other Topics Concern  . Not on file  Social History Narrative  . Not on file   Social Determinants of Health   Financial Resource Strain: Not on file  Food Insecurity: Not on file  Transportation Needs: Not  on file  Physical Activity: Not on file  Stress: Not on file  Social Connections: Not on file   Additional Social History:   Allergies:  No Known Allergies  Labs:  Results for orders placed or performed during the hospital encounter of 08/06/20 (from the past 48 hour(s))  Basic metabolic panel     Status: Abnormal   Collection Time: 08/06/20  7:45 PM  Result  Value Ref Range   Sodium 136 135 - 145 mmol/L   Potassium 3.9 3.5 - 5.1 mmol/L   Chloride 98 98 - 111 mmol/L   CO2 24 22 - 32 mmol/L   Glucose, Bld 104 (H) 70 - 99 mg/dL    Comment: Glucose reference range applies only to samples taken after fasting for at least 8 hours.   BUN 10 6 - 20 mg/dL   Creatinine, Ser 6.28 0.61 - 1.24 mg/dL   Calcium 31.5 8.9 - 17.6 mg/dL   GFR, Estimated >16 >07 mL/min    Comment: (NOTE) Calculated using the CKD-EPI Creatinine Equation (2021)    Anion gap 14 5 - 15    Comment: Performed at [facility], [address], [place], [place] [address]  CBC     Status: None   Collection Time: 08/06/20  7:45 PM  Result Value Ref Range   WBC 7.0 4.0 - 10.5 K/uL   RBC 4.84 4.22 - 5.81 MIL/uL   Hemoglobin 13.9 13.0 - 17.0 g/dL   HCT 26.9 48.5 - 46.2 %   MCV 90.1 80.0 - 100.0 fL   MCH 28.7 26.0 - 34.0 pg   MCHC 31.9 30.0 - 36.0 g/dL   RDW 70.3 50.0 - 93.8 %   Platelets 153 150 - 400 K/uL    Comment: REPEATED TO VERIFY   nRBC 0.0 0.0 - 0.2 %    Comment: Performed at [facility], [address] [place], [place] [address]  Resp Panel by RT-PCR (Flu A&B, Covid) Nasopharyngeal Swab     Status: Abnormal   Collection Time: 08/07/20  8:47 AM   Specimen: Nasopharyngeal Swab; Nasopharyngeal(NP) swabs in vial transport medium  Result Value Ref Range   SARS Coronavirus 2 by RT PCR POSITIVE (A) NEGATIVE    Comment: RESULT CALLED TO, READ BACK BY AND VERIFIED WITH: Donna Bernard RN, AT 1030 08/07/20 D. VANHOOK (NOTE) SARS-CoV-2 target nucleic acids are DETECTED.  The SARS-CoV-2 RNA is generally detectable in upper respiratory specimens during the acute phase of infection. Positive results are indicative of the presence of the identified virus, but do not rule out bacterial infection or co-infection with other pathogens not detected by the test. Clinical correlation with patient history and other diagnostic information is necessary to determine  patient infection status. The expected result is Negative.  Fact Sheet for Patients: BloggerCourse.com  Fact Sheet for Healthcare Providers: SeriousBroker.it  This test is not yet approved or cleared by the [place] FDA and  has been authorized for detection and/or diagnosis of SARS-CoV-2 by FDA under an Emergency Use Authorization (EUA).  This EUA will remain in effect (meaning this tes t can be used) for the duration of  the COVID-19 declaration under Section 564(b)(1) of the Act, 21 U.S.C. section 360bbb-3(b)(1), unless the authorization is terminated or revoked sooner.     Influenza A by PCR NEGATIVE NEGATIVE   Influenza B by PCR NEGATIVE NEGATIVE    Comment: (NOTE) The Xpert Xpress SARS-CoV-2/FLU/RSV plus assay is intended as an aid in the diagnosis  of influenza from Nasopharyngeal swab specimens and should not be used as a sole basis for treatment. Nasal washings and aspirates are unacceptable for Xpert Xpress SARS-CoV-2/FLU/RSV testing.  Fact Sheet for Patients: BloggerCourse.com  Fact Sheet for Healthcare Providers: SeriousBroker.it  This test is not yet approved or cleared by the [place] FDA and has been authorized for detection and/or diagnosis of SARS-CoV-2 by FDA under an Emergency Use Authorization (EUA). This EUA will remain in effect (meaning this test can be used) for the duration of the COVID-19 declaration under Section 564(b)(1) of the Act, 21 U.S.C. section 360bbb-3(b)(1), unless the authorization is terminated or revoked.  Performed at [facility], [address] [place], [place] [address]   Urine rapid drug screen (hosp performed)     Status: None   Collection Time: 08/07/20  8:49 AM  Result Value Ref Range   Opiates NONE DETECTED NONE DETECTED   Cocaine NONE DETECTED NONE DETECTED   Benzodiazepines NONE DETECTED NONE DETECTED    Amphetamines NONE DETECTED NONE DETECTED   Tetrahydrocannabinol NONE DETECTED NONE DETECTED   Barbiturates NONE DETECTED NONE DETECTED    Comment: (NOTE) DRUG SCREEN FOR MEDICAL PURPOSES ONLY.  IF CONFIRMATION IS NEEDED FOR ANY PURPOSE, NOTIFY LAB WITHIN 5 DAYS.  LOWEST DETECTABLE LIMITS FOR URINE DRUG SCREEN Drug Class                     Cutoff (ng/mL) Amphetamine and metabolites    1000 Barbiturate and metabolites    200 Benzodiazepine                 200 Tricyclics and metabolites     300 Opiates and metabolites        300 Cocaine and metabolites        300 THC                            50 Performed at [facility], [address] [place], [place] [address]   Ethanol     Status: None   Collection Time: 08/07/20  9:08 AM  Result Value Ref Range   Alcohol, Ethyl (B) <10 <10 mg/dL    Comment: (NOTE) Lowest detectable limit for serum alcohol is 10 mg/dL.  For medical purposes only. Performed at [facility], [address], [place], [place] [address]    Medications:  Current Facility-Administered Medications  Medication Dose Route Frequency Provider Last Rate Last Admin  . busPIRone (BUSPAR) tablet 10 mg  10 mg Oral TID Leevy-Johnson, Andranik Jeune A, NP      . QUEtiapine (SEROQUEL) tablet 200 mg  200 mg Oral QHS Leevy-Johnson, Wali Reinheimer A, NP      . traZODone (DESYREL) tablet 50 mg  50 mg Oral QHS PRN Leevy-Johnson, Lina Sar, NP       Current Outpatient Medications  Medication Sig Dispense Refill  . busPIRone (BUSPAR) 10 MG tablet Take 1 tablet (10 mg total) by mouth 3 (three) times daily. 90 tablet 2  . hydrOXYzine (ATARAX/VISTARIL) 25 MG tablet TAKE 1 TABLET BY MOUTH THREE TIMES A DAY AS NEEDED 115 tablet 1  . ibuprofen (ADVIL) 800 MG tablet TAKE 1 TABLET (800 MG TOTAL) BY MOUTH EVERY 8 (EIGHT) HOURS AS NEEDED. 30 tablet 3  . QUEtiapine (SEROQUEL) 200 MG tablet Take 1 tablet (200 mg total) by mouth at bedtime. 30 tablet 2  . traZODone (DESYREL) 50 MG tablet Take  1 tablet (50 mg total) by mouth at bedtime as needed for sleep. (Patient not taking: Reported on 06/23/2020) 30 tablet 2   Musculoskeletal: Strength & Muscle Tone: within normal limits Gait & Station: normal Patient leans: N/A  Psychiatric Specialty Exam: Physical Exam Vitals and nursing note reviewed.     Review of Systems  Blood pressure 130/89, pulse 91, temperature 98.1 F (36.7 C), temperature source Oral, resp. rate 16, SpO2 97 %.There is no height or weight on file to calculate BMI.  General Appearance: Casual  Eye Contact:  Fair  Speech:  Clear and Coherent  Volume:  Normal  Mood:  Anxious  Affect:  Congruent  Thought Process:  Coherent  Orientation:  Full (Time, Place, and Person)  Thought Content:  Logical  Suicidal Thoughts:  Yes.  without intent/plan  Homicidal Thoughts:  No  Memory:  Immediate;   Fair Recent;   Fair  Judgement:  Fair  Insight:  Fair and Present  Psychomotor Activity:  Normal  Concentration:  Concentration: Fair and Attention Span: Fair  Recall:  [employer] of Knowledge:  Fair  Language:  Fair  Akathisia:  NA  Handed:  Left  AIMS (if indicated):     Assets:  Communication Skills Housing Physical Health Resilience  ADL's:  Intact  Cognition:  WNL  Sleep:      Treatment Plan Summary: Daily contact with patient to assess and evaluate symptoms and progress in treatment, Medication management and Plan to restart outpatient medications and admit to inpatient psychiatric unit for further observation and stabilization.     Outpatient medications restarted: -Seroquel 200mg  HS -Trazodone 50mg  HS -Ibuprofen 800mg  8hr prn -Buspirone Hcl 10mg  3 times a day  Disposition: Recommend psychiatric Inpatient admission when medically cleared. Supportive therapy provided about ongoing stressors. Discussed crisis plan, support from social network, calling 911, coming to the Emergency Department, and calling Suicide Hotline.  This service was provided  via telemedicine using a 2-way, interactive audio and video technology.  Names of all persons participating in this telemedicine service and their role in this encounter. Name: Role: PMHNP  Name: Role: Attending MD  Name: Role: patient  Name:  Role:     , NP 08/07/2020 11:59 AM

## 2020-08-07 NOTE — ED Notes (Signed)
Pt requesting Benadryl for tongue swelling and throat closure. Upon assesment patient's airway is not swollen. Resp even and unlabored. Patient able to speak in clear, full sentences. Tongue is not swollen. Patient now requesting something to help him sleep.

## 2020-08-08 ENCOUNTER — Other Ambulatory Visit: Payer: Self-pay

## 2020-08-08 ENCOUNTER — Encounter (HOSPITAL_COMMUNITY): Payer: Self-pay | Admitting: Psychiatry

## 2020-08-08 ENCOUNTER — Inpatient Hospital Stay (HOSPITAL_COMMUNITY)
Admission: RE | Admit: 2020-08-08 | Discharge: 2020-08-11 | DRG: 885 | Disposition: A | Discharge status: Home / Self Care | Payer: Federal, State, Local not specified - Other | Source: Intra-hospital | Attending: Psychiatry | Admitting: Psychiatry

## 2020-08-08 DIAGNOSIS — Z8249 Family history of ischemic heart disease and other diseases of the circulatory system: Secondary | ICD-10-CM

## 2020-08-08 DIAGNOSIS — U071 COVID-19: Secondary | ICD-10-CM | POA: Diagnosis present

## 2020-08-08 DIAGNOSIS — F1020 Alcohol dependence, uncomplicated: Secondary | ICD-10-CM | POA: Diagnosis present

## 2020-08-08 DIAGNOSIS — Z833 Family history of diabetes mellitus: Secondary | ICD-10-CM | POA: Diagnosis not present

## 2020-08-08 DIAGNOSIS — Z8042 Family history of malignant neoplasm of prostate: Secondary | ICD-10-CM

## 2020-08-08 DIAGNOSIS — R45851 Suicidal ideations: Secondary | ICD-10-CM | POA: Diagnosis present

## 2020-08-08 DIAGNOSIS — F17210 Nicotine dependence, cigarettes, uncomplicated: Secondary | ICD-10-CM | POA: Diagnosis present

## 2020-08-08 DIAGNOSIS — F1910 Other psychoactive substance abuse, uncomplicated: Secondary | ICD-10-CM | POA: Diagnosis present

## 2020-08-08 DIAGNOSIS — F332 Major depressive disorder, recurrent severe without psychotic features: Principal | ICD-10-CM | POA: Diagnosis present

## 2020-08-08 DIAGNOSIS — Z23 Encounter for immunization: Secondary | ICD-10-CM

## 2020-08-08 DIAGNOSIS — K219 Gastro-esophageal reflux disease without esophagitis: Secondary | ICD-10-CM | POA: Diagnosis present

## 2020-08-08 DIAGNOSIS — Z79899 Other long term (current) drug therapy: Secondary | ICD-10-CM

## 2020-08-08 DIAGNOSIS — M62838 Other muscle spasm: Secondary | ICD-10-CM | POA: Diagnosis present

## 2020-08-08 DIAGNOSIS — R001 Bradycardia, unspecified: Secondary | ICD-10-CM | POA: Diagnosis present

## 2020-08-08 DIAGNOSIS — G4700 Insomnia, unspecified: Secondary | ICD-10-CM | POA: Diagnosis present

## 2020-08-08 DIAGNOSIS — F419 Anxiety disorder, unspecified: Secondary | ICD-10-CM | POA: Diagnosis present

## 2020-08-08 MED ORDER — LORAZEPAM 0.5 MG PO TABS
0.5000 mg | ORAL_TABLET | Freq: Once | ORAL | Status: AC
  Administered 2020-08-08: 0.5 mg via ORAL
  Filled 2020-08-08: qty 1

## 2020-08-08 MED ORDER — ALUM & MAG HYDROXIDE-SIMETH 200-200-20 MG/5ML PO SUSP
30.0000 mL | ORAL | Status: DC | PRN
  Administered 2020-08-09 – 2020-08-10 (×4): 30 mL via ORAL
  Filled 2020-08-08 (×3): qty 30

## 2020-08-08 MED ORDER — BUSPIRONE HCL 10 MG PO TABS
10.0000 mg | ORAL_TABLET | Freq: Three times a day (TID) | ORAL | Status: DC
  Administered 2020-08-09: 10 mg via ORAL
  Filled 2020-08-08 (×4): qty 1

## 2020-08-08 MED ORDER — PNEUMOCOCCAL VAC POLYVALENT 25 MCG/0.5ML IJ INJ
0.5000 mL | INJECTION | INTRAMUSCULAR | Status: AC
  Administered 2020-08-09: 0.5 mL via INTRAMUSCULAR
  Filled 2020-08-08: qty 0.5

## 2020-08-08 MED ORDER — MAGNESIUM HYDROXIDE 400 MG/5ML PO SUSP
30.0000 mL | Freq: Every day | ORAL | Status: DC | PRN
Start: 2020-08-08 — End: 2020-08-11
  Administered 2020-08-09: 30 mL via ORAL
  Filled 2020-08-08: qty 30

## 2020-08-08 MED ORDER — LORAZEPAM 1 MG PO TABS
2.0000 mg | ORAL_TABLET | Freq: Once | ORAL | Status: AC
  Administered 2020-08-08: 2 mg via ORAL
  Filled 2020-08-08: qty 2

## 2020-08-08 MED ORDER — QUETIAPINE FUMARATE 200 MG PO TABS
200.0000 mg | ORAL_TABLET | Freq: Every day | ORAL | Status: DC
  Administered 2020-08-08 – 2020-08-09 (×2): 200 mg via ORAL
  Filled 2020-08-08 (×5): qty 1

## 2020-08-08 MED ORDER — TRAZODONE HCL 50 MG PO TABS
50.0000 mg | ORAL_TABLET | Freq: Every evening | ORAL | Status: DC | PRN
  Filled 2020-08-08: qty 7
  Filled 2020-08-08: qty 1

## 2020-08-08 MED ORDER — ACETAMINOPHEN 325 MG PO TABS
650.0000 mg | ORAL_TABLET | Freq: Four times a day (QID) | ORAL | Status: DC | PRN
  Administered 2020-08-08 – 2020-08-11 (×6): 650 mg via ORAL
  Filled 2020-08-08 (×5): qty 2

## 2020-08-08 MED ORDER — INFLUENZA VAC SPLIT QUAD 0.5 ML IM SUSY
0.5000 mL | PREFILLED_SYRINGE | INTRAMUSCULAR | Status: AC
  Administered 2020-08-09: 0.5 mL via INTRAMUSCULAR
  Filled 2020-08-08: qty 0.5

## 2020-08-08 NOTE — Progress Notes (Signed)
Patient accepted to [facility] 400 hall, call report to [phone].  Nanine Means, PMHNP

## 2020-08-08 NOTE — Progress Notes (Signed)
Patient is 35 yrs old, first admission to [facility], voluntary.  Patient works at [employer],       Had allergic reaction to dogs.  Went to [facility] and was informed that he was positive for covid.  Went to [facility] for treatment.  From [place], near [place], [place].  [facility].  Went to [facility] two months ago for check up.  Dentist three months ago for filling.  No hearing, dental or visual problems.  Has not been outside [place].  No will.  Denied all abuse.  Tattoos on bilateral arms, Scabs on legs from healed bug bites.  Denied surgeries.  Never married, no children.  GED, and graduated with Associate Degree for [employer].  SI off/on, contracts for safety, no plan.  Denied HI.  Does hear mumblings, whispers at times.  Denied visual hallucinations.  Rated anxiety 10, denied depression, hopeless 7.  Tobacco one pack per day since age of 3 yrs.  THC since 35 yrs old until 35 yrs old.  No THC in one yr. Alcohol from 35 yrs old until 35 yrs old, one case every day.  No alcohol since his last admission to hospital.  Denied heroin/cocaine use.   He had been SI to jump off bridge, but denied SI during admission.  White tennis shoes and strings in ekg room on 400 hall.   Patient has been pleasant and cooperative.  Oriented to 400 hall, given food/drink.

## 2020-08-08 NOTE — ED Notes (Signed)
Lunch Tray Ordered @ 1037. 

## 2020-08-08 NOTE — Plan of Care (Signed)
Nurse discussed anxiety, depression and coping skills with patient.  

## 2020-08-09 ENCOUNTER — Telehealth (HOSPITAL_COMMUNITY): Payer: No Payment, Other | Admitting: Psychiatry

## 2020-08-09 DIAGNOSIS — F332 Major depressive disorder, recurrent severe without psychotic features: Principal | ICD-10-CM

## 2020-08-09 MED ORDER — LORAZEPAM 1 MG PO TABS
1.0000 mg | ORAL_TABLET | Freq: Four times a day (QID) | ORAL | Status: DC | PRN
  Administered 2020-08-09 – 2020-08-10 (×3): 1 mg via ORAL
  Filled 2020-08-09 (×4): qty 1

## 2020-08-09 MED ORDER — NICOTINE 21 MG/24HR TD PT24
21.0000 mg | MEDICATED_PATCH | Freq: Every day | TRANSDERMAL | Status: DC
  Administered 2020-08-10 – 2020-08-11 (×2): 21 mg via TRANSDERMAL
  Filled 2020-08-09 (×5): qty 1

## 2020-08-09 MED ORDER — THIAMINE HCL 100 MG PO TABS
100.0000 mg | ORAL_TABLET | Freq: Every day | ORAL | Status: DC
  Administered 2020-08-09 – 2020-08-11 (×3): 100 mg via ORAL
  Filled 2020-08-09 (×4): qty 1

## 2020-08-09 MED ORDER — ACAMPROSATE CALCIUM 333 MG PO TBEC
666.0000 mg | DELAYED_RELEASE_TABLET | Freq: Three times a day (TID) | ORAL | Status: DC
  Administered 2020-08-09 – 2020-08-11 (×7): 666 mg via ORAL
  Filled 2020-08-09 (×9): qty 2

## 2020-08-09 MED ORDER — DIPHENHYDRAMINE HCL 25 MG PO CAPS
50.0000 mg | ORAL_CAPSULE | Freq: Four times a day (QID) | ORAL | Status: DC | PRN
  Administered 2020-08-10 (×2): 50 mg via ORAL
  Filled 2020-08-09: qty 2

## 2020-08-09 MED ORDER — ALBUTEROL SULFATE HFA 108 (90 BASE) MCG/ACT IN AERS: 1.0000 | INHALATION_SPRAY | Freq: Four times a day (QID) | RESPIRATORY_TRACT | Status: DC | PRN

## 2020-08-09 MED ORDER — BUSPIRONE HCL 15 MG PO TABS
15.0000 mg | ORAL_TABLET | Freq: Three times a day (TID) | ORAL | Status: DC
  Administered 2020-08-09 – 2020-08-11 (×7): 15 mg via ORAL
  Filled 2020-08-09 (×9): qty 1

## 2020-08-09 MED ORDER — FLUOXETINE HCL 20 MG PO CAPS
20.0000 mg | ORAL_CAPSULE | Freq: Every day | ORAL | Status: DC
  Administered 2020-08-09 – 2020-08-11 (×3): 20 mg via ORAL
  Filled 2020-08-09 (×4): qty 1

## 2020-08-09 MED ORDER — FOLIC ACID 1 MG PO TABS
1.0000 mg | ORAL_TABLET | Freq: Every day | ORAL | Status: DC
  Administered 2020-08-09 – 2020-08-11 (×3): 1 mg via ORAL
  Filled 2020-08-09 (×5): qty 1

## 2020-08-09 MED ORDER — FAMOTIDINE 20 MG PO TABS
20.0000 mg | ORAL_TABLET | Freq: Two times a day (BID) | ORAL | Status: DC
  Administered 2020-08-09 – 2020-08-11 (×4): 20 mg via ORAL
  Filled 2020-08-09 (×6): qty 1

## 2020-08-09 NOTE — [facility] Group Notes (Signed)
[facility] LCSW Group Therapy  08/09/2020 2:06 PM  Type of Therapy:  Group Therapy  Modes of Intervention:  Exploration and Problem-solving   Due to the COVID-19 pandemic, this group has been supplemented with worksheets.  Summary of Progress/Problems: CSW provided worksheet to patient due to COVID. CSW offered to meet individually with patient as needed.   Belina Mandile A Keturah Yerby 08/09/2020, 2:06 PM   

## 2020-08-09 NOTE — Plan of Care (Signed)
Nurse discussed anxiety, depression and coping skills with patient.  

## 2020-08-09 NOTE — Tx Team (Signed)
Interdisciplinary Treatment and Diagnostic Plan Update  08/09/2020 Time of Session: 9:45am Tyrone White MRN: [id number]  Principal Diagnosis: <principal problem not specified>  Secondary Diagnoses: Active Problems:   Major depressive disorder, recurrent severe without psychotic features ([place])   Current Medications:  Current Facility-Administered Medications  Medication Dose Route Frequency Provider Last Rate Last Admin  . acamprosate (CAMPRAL) tablet 666 mg  666 mg Oral TID WC Tyrone Covert, MD   666 mg at 08/09/20 1125  . acetaminophen (TYLENOL) tablet 650 mg  650 mg Oral Q6H PRN Tyrone Pour, NP   650 mg at 08/09/20 0931  . albuterol (VENTOLIN HFA) 108 (90 Base) MCG/ACT inhaler 1-2 puff  1-2 puff Inhalation Q6H PRN Tyrone Covert, MD      . alum & mag hydroxide-simeth (MAALOX/MYLANTA) 200-200-20 MG/5ML suspension 30 mL  30 mL Oral Q4H PRN Tyrone Pour, NP   30 mL at 08/09/20 0930  . busPIRone (BUSPAR) tablet 15 mg  15 mg Oral TID Tyrone Covert, MD   15 mg at 08/09/20 1126  . diphenhydrAMINE (BENADRYL) capsule 50 mg  50 mg Oral Q6H PRN Tyrone Covert, MD      . famotidine (PEPCID) tablet 20 mg  20 mg Oral BID Tyrone Covert, MD   20 mg at 08/09/20 1126  . FLUoxetine (PROZAC) capsule 20 mg  20 mg Oral Daily Tyrone Covert, MD   20 mg at 08/09/20 1126  . folic acid (FOLVITE) tablet 1 mg  1 mg Oral Daily Tyrone Covert, MD   1 mg at 08/09/20 1127  . LORazepam (ATIVAN) tablet 1 mg  1 mg Oral Q6H PRN Tyrone Covert, MD   1 mg at 08/09/20 1129  . magnesium hydroxide (MILK OF MAGNESIA) suspension 30 mL  30 mL Oral Daily PRN Tyrone Pour, NP   30 mL at 08/09/20 0930  . QUEtiapine (SEROQUEL) tablet 200 mg  200 mg Oral QHS Tyrone Pour, NP   200 mg at 08/08/20 2119  . thiamine tablet 100 mg  100 mg Oral Daily Tyrone Covert, MD   100 mg at 08/09/20 1127  . traZODone (DESYREL) tablet 50 mg  50 mg Oral QHS PRN Tyrone Pour, NP       PTA  Medications: Medications Prior to Admission  Medication Sig Dispense Refill Last Dose  . busPIRone (BUSPAR) 10 MG tablet Take 1 tablet (10 mg total) by mouth 3 (three) times daily. (Patient taking differently: Take 10 mg by mouth 3 (three) times daily as needed (anxiety).) 90 tablet 2   . hyoscyamine (LEVSIN SL) 0.125 MG SL tablet Place 0.125 mg under the tongue 2 (two) times daily as needed for cramping.     . pantoprazole (PROTONIX) 40 MG tablet Take 40 mg by mouth every morning.     . polyvinyl alcohol (ARTIFICIAL TEARS) 1.4 % ophthalmic solution Place 1 drop into both eyes as needed for dry eyes.     Marland Kitchen QUEtiapine (SEROQUEL) 200 MG tablet Take 1 tablet (200 mg total) by mouth at bedtime. 30 tablet 2     Patient Stressors:    Patient Strengths:    Treatment Modalities: Medication Management, Group therapy, Case management,  1 to 1 session with clinician, Psychoeducation, Recreational therapy.   Physician Treatment Plan for Primary Diagnosis: <principal problem not specified> Long Term Goal(s):     Short Term Goals:    Medication Management: Evaluate patient's response, side effects, and tolerance of medication  regimen.  Therapeutic Interventions: 1 to 1 sessions, Unit Group sessions and Medication administration.  Evaluation of Outcomes: Not Met  Physician Treatment Plan for Secondary Diagnosis: Active Problems:   Major depressive disorder, recurrent severe without psychotic features ([place])  Long Term Goal(s):     Short Term Goals:       Medication Management: Evaluate patient's response, side effects, and tolerance of medication regimen.  Therapeutic Interventions: 1 to 1 sessions, Unit Group sessions and Medication administration.  Evaluation of Outcomes: Not Met   RN Treatment Plan for Primary Diagnosis: <principal problem not specified> Long Term Goal(s): Knowledge of disease and therapeutic regimen to maintain health will improve  Short Term Goals: Ability to  remain free from injury will improve, Ability to verbalize frustration and anger appropriately will improve, Ability to participate in decision making will improve, Ability to identify and develop effective coping behaviors will improve and Compliance with prescribed medications will improve  Medication Management: RN will administer medications as ordered by provider, will assess and evaluate patient's response and provide education to patient for prescribed medication. RN will report any adverse and/or side effects to prescribing provider.  Therapeutic Interventions: 1 on 1 counseling sessions, Psychoeducation, Medication administration, Evaluate responses to treatment, Monitor vital signs and CBGs as ordered, Perform/monitor CIWA, COWS, AIMS and Fall Risk screenings as ordered, Perform wound care treatments as ordered.  Evaluation of Outcomes: Not Met   LCSW Treatment Plan for Primary Diagnosis: <principal problem not specified> Long Term Goal(s): Safe transition to appropriate next level of care at discharge, Engage patient in therapeutic group addressing interpersonal concerns.  Short Term Goals: Engage patient in aftercare planning with referrals and resources, Increase social support, Increase ability to appropriately verbalize feelings, Identify triggers associated with mental health/substance abuse issues and Increase skills for wellness and recovery  Therapeutic Interventions: Assess for all discharge needs, 1 to 1 time with Social worker, Explore available resources and support systems, Assess for adequacy in community support network, Educate family and significant other(s) on suicide prevention, Complete Psychosocial Assessment, Interpersonal group therapy.  Evaluation of Outcomes: Not Met   Progress in Treatment: Attending groups: Yes. Participating in groups: Yes. Taking medication as prescribed: Yes. Toleration medication: Yes. Family/Significant other contact made: No, will  contact:  Tyrone White with [facility] Patient understands diagnosis: Yes. Discussing patient identified problems/goals with staff: Yes. Medical problems stabilized or resolved: Yes. Denies suicidal/homicidal ideation: Yes. Issues/concerns per patient self-inventory: No.   New problem(s) identified: No, Describe:  none  New Short Term/Long Term Goal(s): medication stabilization, elimination of SI thoughts, development of comprehensive mental wellness plan.   Patient Goals:  Did not attend.  Discharge Plan or Barriers: Patient currently resides at [facility] and plans to return at discharge. Patient is established with [facility] for therapy and medication management.  Reason for Continuation of Hospitalization: Depression Medication stabilization  Estimated Length of Stay: 1-3 days   Attendees: Patient: Did not attend 08/09/2020   Physician:  08/09/2020   Nursing:  08/09/2020   RN Care Manager: 08/09/2020   Social Worker: Darletta Moll, LCSW 08/09/2020   Recreational Therapist:  08/09/2020   Other:  08/09/2020   Other:  08/09/2020   Other: 08/09/2020      Scribe for Treatment Team: Vassie Moselle, LCSW 08/09/2020 1:08 PM

## 2020-08-09 NOTE — H&P (Signed)
Psychiatric Admission Assessment Adult  Patient Identification: Tyrone White MRN:  [id number] Date of Evaluation:  08/09/2020 Chief Complaint:  Major depressive disorder, recurrent severe without psychotic features (H[facility]) [F33.2] Principal Diagnosis: <principal problem not specified> Diagnosis:  Active Problems:   Major depressive disorder, recurrent severe without psychotic features (H[facility])  History of Present Illness: Patient is seen and examined.  Patient is a 35 year old male with a past psychiatric history significant for polysubstance use disorders who [facility] secondary to an allergic reaction to an unspecified pill that he had taken.  The patient reported that he was [facility], and took a friend's "Cogentin and lorazepam".  He denied any respiratory issues or swallowing issues but reported trouble speaking at times.  He stated his face was having muscle spasms.  He was discharged from the urgent care center and sent to the emergency department via EMS.  When he [facility] at [facility] department he then reported that he took the benzodiazepines to get high, and that this was "an attempt at self-harm".  He reported that [facility] had [facility] for approximately a year, and been doing well.  He then apparently tested positive for COVID, and was placed in a hotel.  Once he was placed in the hotel he began abusing substances again.  He told me began drinking alcohol of approximately 6-7 beers a day for a few days.  He told the clinic comprehensive clinical assessment team that he had been slipping up recently and drinking more recently.  He admitted to poor sleep, racing thoughts.  [place] in [place], and that is where all of his family was.  He was at some rehabilitation facility prior to [facility].  Previously he stated that he would use methamphetamines, marijuana,  alcohol and pills.  Apparently he has been seen at the behavioral health urgent care center with his last visit on 06/29/2020.  The only psychotherapy note with any details is from 03/10/2020.  At that time he was being treated with Zoloft, hydroxyzine, Seroquel and trazodone.  He is a poor historian and unable to really tell me who had prescribed his medications on a regular basis, and despite the notes in the chart that he had been seeing [facility] the [facility] since early 2021.  His main interest right now is getting Ativan.  He was admitted to the hospital for evaluation and stabilization.  Associated Signs/Symptoms: Depression Symptoms:  depressed mood, anhedonia, insomnia, psychomotor agitation, fatigue, feelings of worthlessness/guilt, difficulty concentrating, hopelessness, suicidal thoughts without plan, anxiety, loss of energy/fatigue, Duration of Depression Symptoms: Greater than two weeks  (Hypo) Manic Symptoms:  Impulsivity, Irritable Mood, Labiality of Mood, Anxiety Symptoms:  Excessive Worry, Psychotic Symptoms:  Denied Duration of Psychotic Symptoms: No data recorded PTSD Symptoms: Negative Total Time spent with patient: 45 minutes  Past Psychiatric History: Patient admitted to an admission to substance abuse [place].  He was there for unknown amount of time and then was [facility].  He previously abused alcohol, cocaine, methamphetamines, marijuana and pills.  Most recently it sounds like its been primarily alcohol and benzodiazepines.  He is unsure who started his current medications and is unsure who is prescribing his current medications.  He has been seen [facility] for therapy..Marland Kitchen  His last visit there was in December 2021.  Is the patient at risk to self? Yes.    Has the patient been a risk to self  in the past 6 months? No.  Has the patient been a risk to self within the distant past? No.  Is the patient a risk to others? No.  Has the patient been a risk to others in the past 6 months? No.  Has the patient been a risk to others within the distant past? No.   Prior Inpatient Therapy:   Prior Outpatient Therapy:    Alcohol Screening: 1. How often do you have a drink containing alcohol?: 4 or more times a week 2. How many drinks containing alcohol do you have on a typical day when you are drinking?: 10 or more 3. How often do you have six or more drinks on one occasion?: Weekly AUDIT-C Score: 11 4. How often during the last year have you found that you were not able to stop drinking once you had started?: Weekly 5. How often during the last year have you failed to do what was normally expected from you because of drinking?: Weekly 6. How often during the last year have you needed a first drink in the morning to get yourself going after a heavy drinking session?: Weekly 7. How often during the last year have you had a feeling of guilt of remorse after drinking?: Weekly 8. How often during the last year have you been unable to remember what happened the night before because you had been drinking?: Weekly 9. Have you or someone else been injured as a result of your drinking?: Yes, but not in the last year 10. Has a relative or friend or a doctor or another health worker been concerned about your drinking or suggested you cut down?: Yes, during the last year Alcohol Use Disorder Identification Test Final Score (AUDIT): 32 Alcohol Brief Interventions/Follow-up: Alcohol Education Substance Abuse History in the last 12 months:  Yes.   Consequences of Substance Abuse: Medical Consequences:  Although his presentation to the emergency room was secondary to a concern of some adverse reaction to an unspecified pill, he took the pill with what he thought was benzodiazepines in an act of substance  abuse. Previous Psychotropic Medications: Yes  Psychological Evaluations: Yes  Past Medical History:  Past Medical History:  Diagnosis Date  . Anxiety   . 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 Depression    History reviewed. No pertinent surgical history. Family History:  Family History  Problem Relation Age of Onset  . Heart disease Mother   . Hypertension Mother   . Diabetes Maternal Uncle   . Prostate cancer Father    Family Psychiatric  History: Unknown Tobacco Screening: Have you used any form of tobacco in the last 30 days? (Cigarettes, Smokeless Tobacco, Cigars, and/or Pipes): Yes Tobacco use, Select all that apply: 5 or more cigarettes per day Are you interested in Tobacco Cessation Medications?: Yes, will notify MD for an order Counseled patient on smoking cessation including recognizing danger situations, developing coping skills and basic information about quitting provided: Yes Social History:  Social History   Substance and Sexual Activity  Alcohol Use Not Currently     Social History   Substance and Sexual Activity  Drug Use Not Currently    Additional Social History: Marital status: Single Are you sexually active?: No What is your sexual orientation?: Heterosexual Has  your sexual activity been affected by drugs, alcohol, medication, or emotional stress?: Denies Does patient have children?: No    Pain Medications: see MAR Prescriptions: see MAR Over the Counter: see MAR History of alcohol / drug use?: Yes Longest period of sobriety (when/how long): unknown Negative Consequences of Use: Financial,Work / School                    Allergies:  No Known Allergies Lab Results: No results found for this or any previous visit (from the past 48 hour(s)).  Blood Alcohol level:  Lab Results  Component Value Date   ETH <10 08/07/2020    Metabolic Disorder Labs:  Lab Results  Component Value Date   HGBA1C  5.7 (A) 03/02/2020   HGBA1C 5.7 03/02/2020   HGBA1C 5.7 03/02/2020   HGBA1C 5.7 03/02/2020   No results found for: PROLACTIN Lab Results  Component Value Date   CHOL 164 06/23/2020   TRIG 84 06/23/2020   HDL 50 06/23/2020   CHOLHDL 3.3 06/23/2020   LDLCALC 98 06/23/2020    Current Medications: Current Facility-Administered Medications  Medication Dose Route Frequency Provider Last Rate Last Admin  . acamprosate (CAMPRAL) tablet 666 mg  666 mg Oral TID WC Antonieta Pert, MD   666 mg at 08/09/20 1125  . acetaminophen (TYLENOL) tablet 650 mg  650 mg Oral Q6H PRN Charm Rings, NP   650 mg at 08/09/20 0931  . albuterol (VENTOLIN HFA) 108 (90 Base) MCG/ACT inhaler 1-2 puff  1-2 puff Inhalation Q6H PRN Antonieta Pert, MD      . alum & mag hydroxide-simeth (MAALOX/MYLANTA) 200-200-20 MG/5ML suspension 30 mL  30 mL Oral Q4H PRN Charm Rings, NP   30 mL at 08/09/20 0930  . busPIRone (BUSPAR) tablet 15 mg  15 mg Oral TID Antonieta Pert, MD   15 mg at 08/09/20 1126  . diphenhydrAMINE (BENADRYL) capsule 50 mg  50 mg Oral Q6H PRN Antonieta Pert, MD      . famotidine (PEPCID) tablet 20 mg  20 mg Oral BID Antonieta Pert, MD   20 mg at 08/09/20 1126  . FLUoxetine (PROZAC) capsule 20 mg  20 mg Oral Daily Antonieta Pert, MD   20 mg at 08/09/20 1126  . folic acid (FOLVITE) tablet 1 mg  1 mg Oral Daily Antonieta Pert, MD   1 mg at 08/09/20 1127  . LORazepam (ATIVAN) tablet 1 mg  1 mg Oral Q6H PRN Antonieta Pert, MD   1 mg at 08/09/20 1129  . magnesium hydroxide (MILK OF MAGNESIA) suspension 30 mL  30 mL Oral Daily PRN Charm Rings, NP   30 mL at 08/09/20 0930  . nicotine (NICODERM CQ - dosed in mg/24 hours) patch 21 mg  21 mg Transdermal Daily Antonieta Pert, MD      . QUEtiapine (SEROQUEL) tablet 200 mg  200 mg Oral QHS Charm Rings, NP   200 mg at 08/08/20 2119  . thiamine tablet 100 mg  100 mg Oral Daily Antonieta Pert, MD   100 mg at 08/09/20 1127  .  traZODone (DESYREL) tablet 50 mg  50 mg Oral QHS PRN Charm Rings, NP       PTA Medications: Medications Prior to Admission  Medication Sig Dispense Refill Last Dose  . busPIRone (BUSPAR) 10 MG tablet Take 1 tablet (10 mg total) by mouth 3 (three) times daily. (Patient taking differently: Take  10 mg by mouth 3 (three) times daily as needed (anxiety).) 90 tablet 2   . hyoscyamine (LEVSIN SL) 0.125 MG SL tablet Place 0.125 mg under the tongue 2 (two) times daily as needed for cramping.     . pantoprazole (PROTONIX) 40 MG tablet Take 40 mg by mouth every morning.     . polyvinyl alcohol (ARTIFICIAL TEARS) 1.4 % ophthalmic solution Place 1 drop into both eyes as needed for dry eyes.     Marland Kitchen QUEtiapine (SEROQUEL) 200 MG tablet Take 1 tablet (200 mg total) by mouth at bedtime. 30 tablet 2     Musculoskeletal: Strength & Muscle Tone: within normal limits Gait & Station: normal Patient leans: N/A  Psychiatric Specialty Exam: Physical Exam Vitals and nursing note reviewed.  HENT:     Head: Normocephalic and atraumatic.  Pulmonary:     Effort: Pulmonary effort is normal.  Neurological:     General: No focal deficit present.     Mental Status: He is alert and oriented to person, place, and time.     Review of Systems  Blood pressure 132/82, pulse 87, temperature 98.2 F (36.8 C), temperature source Oral, resp. rate 18, height 6\' 1"  (1.854 m), SpO2 99 %.Body mass index is 27.71 kg/m.  General Appearance: Disheveled  Eye Contact:  Fair  Speech:  Normal Rate  Volume:  Increased  Mood:  Anxious and Dysphoric  Affect:  Congruent  Thought Process:  Coherent and Descriptions of Associations: Circumstantial  Orientation:  Full (Time, Place, and Person)  Thought Content:  Logical  Suicidal Thoughts:  No  Homicidal Thoughts:  No  Memory:  Immediate;   Poor Recent;   Poor Remote;   Poor  Judgement:  Intact  Insight:  Lacking  Psychomotor Activity:  Increased  Concentration:   Concentration: Fair  Recall:  of Knowledge:  Fair  Language:  Good  Akathisia:  Negative  Handed:  Right  AIMS (if indicated):     Assets:  Desire for Improvement Resilience  ADL's:  Intact  Cognition:  WNL  Sleep:  Number of Hours: 6.5    Treatment Plan Summary: Daily contact with patient to assess and evaluate symptoms and progress in treatment, Medication management and Plan : Patient is seen and examined.  Patient is a 35 year old male with the above-stated past psychiatric history who was admitted secondary to relapse to substances and worsening of his mood.  He will be admitted to the hospital.  He will be integrated in the milieu.  He will be encouraged to attend groups.  We will continue his Seroquel, trazodone, BuSpar.  I am not sure why the Zoloft was not continued, but he had been on low-dose.  In case it was because of side effects I will place him on fluoxetine 20 mg p.o. daily now.  He will also have hydroxyzine for anxiety and trazodone for sleep.  I will also write for an albuterol inhaler if necessary.  Apparently he is essentially homeless.  He is unsure whether or not he can go back to [facility].  He wants to talk to social work about where he is going to go after discharge.  Review of his admission laboratories revealed that despite his substance history liver function enzymes were not obtained.  The last labs we have in the chart are from June 24, 2020, and they were normal at that time.  His vital signs are stable, he is afebrile pulse oximetry on room air was  99%.  I will also write for Benadryl for breakthrough allergic symptoms.  He is very much med seeking with regard to lorazepam, and I am not sure how much he has been truly using.  I am going to write for a CIWA greater than 10 that he will be able to have lorazepam 1 mg p.o. every 6 hours.  Additionally we will order other labs, but we have already been informed that lab services are depleted today, and  they will not be able to go to our hospital.  If any emergent situation occurs we will [facility].  He is unsure whether or not he can [facility], and I suspect there may be problems with that and that placement may be an issue.  Observation Level/Precautions:  Detox 15 minute checks  Laboratory:  Chemistry Profile  Psychotherapy:    Medications:    Consultations:    Discharge Concerns:    Estimated LOS:  Other:     Physician Treatment Plan for Primary Diagnosis: <principal problem not specified> Long Term Goal(s): Improvement in symptoms so as ready for discharge  Short Term Goals: Ability to identify changes in lifestyle to reduce recurrence of condition will improve, Ability to verbalize feelings will improve, Ability to disclose and discuss suicidal ideas, Ability to demonstrate self-control will improve, Ability to identify and develop effective coping behaviors will improve, Ability to maintain clinical measurements within normal limits will improve and Ability to identify triggers associated with substance abuse/mental health issues will improve  Physician Treatment Plan for Secondary Diagnosis: Active Problems:   Major depressive disorder, recurrent severe without psychotic features (HCC)  Long Term Goal(s): Improvement in symptoms so as ready for discharge  Short Term Goals: Ability to identify changes in lifestyle to reduce recurrence of condition will improve, Ability to verbalize feelings will improve, Ability to disclose and discuss suicidal ideas, Ability to demonstrate self-control will improve, Ability to identify and develop effective coping behaviors will improve, Ability to maintain clinical measurements within normal limits will improve and Ability to identify triggers associated with substance abuse/mental health issues will improve  I certify that inpatient services furnished can  reasonably be expected to improve the patient's condition.    Antonieta PertGreg Lawson Tyleigh Mahn, MD 1/17/20224:22 PM

## 2020-08-09 NOTE — Progress Notes (Signed)
Adult Psychoeducational Group Note  Date:  08/09/2020 Time:  12:21 AM  Group Topic/Focus:  Wrap-Up Group:   The focus of this group is to help patients review their daily goal of treatment and discuss progress on daily workbooks.  Participation Level:  Minimal  Participation Quality:  Appropriate  Affect:  Anxious, Depressed, Flat and Irritable  Cognitive:  Disorganized and Confused  Insight: Limited  Engagement in Group:  Limited  Modes of Intervention:  Discussion  Additional Comments: Pt is a new admission today.  Pt stated his goal for today was to focus on his treatment plan. Pt stated he accomplished his goal today.  Pt rated his overall day a 5 out of 10. Pt stated he was very depressed.  Pt stated he received all meals today. Pt stated he took all medications provided today. Pt stated he felt about the same as yesterday, no improvements. Pt stated his appetite was good today. Pt rated sleep last night was poor. Pt stated the goal tonight is to get some rest. Pt stated he was some physical pain today. Pt stated his lower back was in some pain. Pt rated the pain level a 7. Pt nurse was updated on situation. Pt deny visual hallucinations. Pt admitted to experencing some auditory issues today. Pt stated the last time he experienced auditory issues was about ago. Pt nurse was updated on situation. Pt denies thoughts of others. Pt admitted to having thoughts of harming himself. Pt stated he could contract for safety. Pt nurse was updated on situation. Pt stated he would alert staff if anything changes.  Felipa Furnace 08/09/2020, 12:21 AM

## 2020-08-09 NOTE — Progress Notes (Signed)
  D: Patient is alert and oriented x 4. Patient denies SI/HI/ and AVH. Disposition is Calm and cooperative with appropriate affect. Verbally contracts for safety to this Clinical research associate.   A:  Pt was given scheduled medications. Pt c/o anxiety and was offered PRN medication. Pt took po medication and reported a decrease in anxiety at reassessment. Q 15 minute checks were done for safety. Pt was visualized interacting appropriately with others in the milieu. Pt was offered support and encouragement by this Clinical research associate. Pt is goal oriented and stated goal was reached for the day. Pt attended groups and interacted appropiately well with peers and staff. Pt complied with scheduled medications. No signs of distress nor concerns reported by patient at present.Pt remains receptive to treatment and safety maintained on unit.    R: Will continue to monitor and assess. Safety maintained during this shift.

## 2020-08-09 NOTE — BHH Suicide Risk Assessment (Signed)
[facility] Admission Suicide Risk Assessment   Nursing information obtained from:  Patient Demographic factors:  Male,Low socioeconomic status Current Mental Status:  Self-harm thoughts Loss Factors:  Financial problems / change in socioeconomic status Historical Factors:  Prior suicide attempts,Impulsivity Risk Reduction Factors:  Employed  Total Time spent with patient: 30 minutes Principal Problem: <principal problem not specified> Diagnosis:  Active Problems:   Major depressive disorder, recurrent severe without psychotic features (HCC)  Subjective Data: Patient is seen and examined.  Patient is a 35 year old male with a past psychiatric history significant for polysubstance use disorders who originally presented to the [facility] on 08/06/2020 secondary to an allergic reaction to an unspecified pill that he had taken.  The patient reported that he was staying at a [employer], and took a friend's "Cogentin and lorazepam".  He denied any respiratory issues or swallowing issues but reported trouble speaking at times.  He stated his face was having muscle spasms.  He was discharged from the urgent care center and sent to the emergency department via EMS.  When he arrived at the [facility] emergency department he then reported that he took the benzodiazepines to get high, and that this was "an attempt at self-harm".  He reported that he had been at [facility] house for approximately a year, and been doing well.  He then apparently tested positive for COVID, and was placed in a hotel.  Once he was placed in the hotel he began abusing substances again.  He told me began drinking alcohol of approximately 6-7 beers a day for a few days.  He told the clinic comprehensive clinical assessment team that he had been slipping up recently and drinking more recently.  He admitted to poor sleep, racing thoughts.  He lives in [place], and that is where all of his family was.  He was  at some rehabilitation facility prior to coming to [facility] house.  Previously he stated that he would use methamphetamines, marijuana, alcohol and pills.  Apparently he has been seen at the behavioral health urgent care center with his last visit on 06/29/2020.  The only psychotherapy note with any details is from 03/10/2020.  At that time he was being treated with Zoloft, hydroxyzine, Seroquel and trazodone.  He is a poor historian and unable to really tell me who had prescribed his medications on a regular basis, and despite the notes in the chart that he had been seeing at the [facility] behavioral health urgent care center since early 2021.  His main interest right now is getting Ativan.  He was admitted to the hospital for evaluation and stabilization.  Continued Clinical Symptoms:  Alcohol Use Disorder Identification Test Final Score (AUDIT): 32 The "Alcohol Use Disorders Identification Test", Guidelines for Use in Primary Care, Second Edition.  World Science writer [facility]). Score between 0-7:  no or low risk or alcohol related problems. Score between 8-15:  moderate risk of alcohol related problems. Score between 16-19:  high risk of alcohol related problems. Score 20 or above:  warrants further diagnostic evaluation for alcohol dependence and treatment.   CLINICAL FACTORS:   Bipolar Disorder:   Mixed State Alcohol/Substance Abuse/Dependencies   Musculoskeletal: Strength & Muscle Tone: within normal limits Gait & Station: normal Patient leans: N/A  Psychiatric Specialty Exam: Physical Exam Vitals and nursing note reviewed.  HENT:     Head: Normocephalic and atraumatic.  Pulmonary:     Effort: Pulmonary effort is normal.  Neurological:  General: No focal deficit present.     Mental Status: He is alert and oriented to person, place, and time.     Review of Systems  Blood pressure 132/82, pulse 87, temperature 98.2 F (36.8 C), temperature source Oral, resp. rate 18,  height 6\' 1"  (1.854 m), SpO2 99 %.Body mass index is 27.71 kg/m.  General Appearance: Casual  Eye Contact:  Fair  Speech:  Normal Rate  Volume:  Normal  Mood:  Anxious  Affect:  Congruent  Thought Process:  Coherent and Descriptions of Associations: Circumstantial  Orientation:  Full (Time, Place, and Person)  Thought Content:  Logical  Suicidal Thoughts:  Yes.  without intent/plan  Homicidal Thoughts:  No  Memory:  Immediate;   Poor Recent;   Poor Remote;   Poor  Judgement:  Impaired  Insight:  Lacking  Psychomotor Activity:  Increased  Concentration:  Concentration: Fair and Attention Span: Fair  Recall:  of Knowledge:  Fair  Language:  Good  Akathisia:  Negative  Handed:  Right  AIMS (if indicated):     Assets:  Desire for Improvement Resilience  ADL's:  Intact  Cognition:  WNL  Sleep:  Number of Hours: 6.5      COGNITIVE FEATURES THAT CONTRIBUTE TO RISK:  None    SUICIDE RISK:   Mild:  Suicidal ideation of limited frequency, intensity, duration, and specificity.  There are no identifiable plans, no associated intent, mild dysphoria and related symptoms, good self-control (both objective and subjective assessment), few other risk factors, and identifiable protective factors, including available and accessible social support.  PLAN OF CARE: Patient is seen and examined.  Patient is a 35 year old male with the above-stated past psychiatric history who was admitted secondary to relapse to substances and worsening of his mood.  He will be admitted to the hospital.  He will be integrated in the milieu.  He will be encouraged to attend groups.  We will continue his Seroquel, trazodone, BuSpar.  I am not sure why the Zoloft was not continued, but he had been on low-dose.  In case it was because of side effects I will place him on fluoxetine 20 mg p.o. daily now.  He will also have hydroxyzine for anxiety and trazodone for sleep.  I will also write for an albuterol  inhaler if necessary.  Apparently he is essentially homeless.  He is unsure whether or not he can go back to 20.  He wants to talk to social work about where he is going to go after discharge.  Review of his admission laboratories revealed that despite his substance history liver function enzymes were not obtained.  The last labs we have in the chart are from June 24, 2020, and they were normal at that time.  His vital signs are stable, he is afebrile pulse oximetry on room air was 99%.  I will also write for Benadryl for breakthrough allergic symptoms.  He is very much med seeking with regard to lorazepam, and I am not sure how much he has been truly using.  I am going to write for a CIWA greater than 10 that he will be able to have lorazepam 1 mg p.o. every 6 hours.  Additionally we will order other labs, but we have already been informed that lab services are depleted today, and they will not be able to go to our hospital.  If any emergent situation occurs we will transport him back to the June 26, 2020  Hospital emergency department.  He is unsure whether or not he can return to the [facility] house, and I suspect there may be problems with that and that placement may be an issue.  I certify that inpatient services furnished can reasonably be expected to improve the patient's condition.   Antonieta Pert, MD 08/09/2020, 10:40 AM

## 2020-08-09 NOTE — Progress Notes (Signed)
D:  Patient stated he does hear voices sometimes to hurt himself, jump off bridge.  Denied SI and denied visual hallucinations. A:  Medications administered per MD orders.  Emotional support and encouragement given patient. R:  Safety maintained with 15 minute checks.
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
## 2020-08-09 NOTE — BHH Suicide Risk Assessment (Signed)
BHH INPATIENT:  Family/Significant Other Suicide Prevention Education  Suicide Prevention Education: Education Completed; [name] with [employer]  [phone]), has been identified by the patient as the family member/significant other with whom the patient will be residing, and identified as the person(s) who will aid the patient in the event of a mental health crisis (suicidal ideations/suicide attempt).  With written consent from the patient, the family member/significant other has been provided the following suicide prevention education, prior to the and/or following the discharge of the patient.  The suicide prevention education provided includes the following:  Suicide risk factors  Suicide prevention and interventions  National Suicide Hotline telephone number  [facility] assessment telephone number  [facility] Emergency Assistance 911  [facility] and/or Residential Mobile Crisis Unit telephone number   Request made of family/significant other to:  Remove weapons (e.g., guns, rifles, knives), all items previously/currently identified as safety concern.    Remove drugs/medications (over-the-counter, prescriptions, illicit drugs), all items previously/currently identified as a safety concern.   The family member/significant other verbalizes understanding of the suicide prevention education information provided.  The family member/significant other agrees to remove the items of safety concern listed above.  Kandis Mannan stated he had no safety concerns with this patient returning to [facility] at discharge.   Ruthann Cancer MSW, LCSW Clincal Social Worker  [facility]

## 2020-08-10 LAB — COMPREHENSIVE METABOLIC PANEL
ALT: 35 U/L (ref 0–44)
AST: 32 U/L (ref 15–41)
Albumin: 4.8 g/dL (ref 3.5–5.0)
Alkaline Phosphatase: 72 U/L (ref 38–126)
Anion gap: 11 (ref 5–15)
BUN: 17 mg/dL (ref 6–20)
CO2: 26 mmol/L (ref 22–32)
Calcium: 10.1 mg/dL (ref 8.9–10.3)
Chloride: 102 mmol/L (ref 98–111)
Creatinine, Ser: 0.93 mg/dL (ref 0.61–1.24)
GFR, Estimated: >60 mL/min (ref >60)
Glucose, Bld: 114 mg/dL — ABNORMAL HIGH (ref 70–99)
Potassium: 4.1 mmol/L (ref 3.5–5.1)
Sodium: 139 mmol/L (ref 135–145)
Total Bilirubin: 0.3 mg/dL (ref 0.3–1.2)
Total Protein: 7.9 g/dL (ref 6.5–8.1)

## 2020-08-10 LAB — TSH: TSH: 0.817 u[IU]/mL (ref 0.350–4.500)

## 2020-08-10 MED ORDER — GABAPENTIN 100 MG PO CAPS
200.0000 mg | ORAL_CAPSULE | Freq: Three times a day (TID) | ORAL | Status: DC
  Administered 2020-08-10: 200 mg via ORAL
  Filled 2020-08-10 (×7): qty 2

## 2020-08-10 MED ORDER — QUETIAPINE FUMARATE 400 MG PO TABS
400.0000 mg | ORAL_TABLET | Freq: Every day | ORAL | Status: DC
  Filled 2020-08-10: qty 1

## 2020-08-10 MED ORDER — NAPROXEN 375 MG PO TABS
375.0000 mg | ORAL_TABLET | Freq: Two times a day (BID) | ORAL | Status: DC
  Filled 2020-08-10 (×2): qty 1

## 2020-08-10 MED ORDER — GABAPENTIN 300 MG PO CAPS
300.0000 mg | ORAL_CAPSULE | Freq: Three times a day (TID) | ORAL | Status: DC
  Administered 2020-08-10 – 2020-08-11 (×3): 300 mg via ORAL
  Filled 2020-08-10 (×6): qty 1

## 2020-08-10 MED ORDER — QUETIAPINE FUMARATE 300 MG PO TABS
300.0000 mg | ORAL_TABLET | Freq: Every day | ORAL | Status: DC
  Administered 2020-08-10: 300 mg via ORAL
  Filled 2020-08-10 (×3): qty 1

## 2020-08-10 NOTE — Plan of Care (Signed)
  Problem: Education: Goal: Ability to state activities that reduce stress will improve Outcome: Progressing   Problem: Education: Goal: Utilization of techniques to improve thought processes will improve Outcome: Progressing Goal: Knowledge of the prescribed therapeutic regimen will improve Outcome: Progressing   Problem: Education: Goal: Knowledge of [facility] General Education information/materials will improve Outcome: Progressing   

## 2020-08-10 NOTE — Progress Notes (Signed)
Progress note    08/10/20 0810  Psych Admission Type (Psych Patients Only)  Admission Status Voluntary  Psychosocial Assessment  Patient Complaints Anxiety;Depression;Restlessness;Worrying  Eye Contact Fair  Facial Expression Anxious;Pensive  Affect Anxious;Depressed;Sad;Sullen  Speech Logical/coherent  Interaction Assertive;Manipulative;Needy  Motor Activity Fidgety;Restless  Appearance/Hygiene Unremarkable  Behavior Characteristics Cooperative;Appropriate to situation;Anxious  Mood Anxious;Sad;Sullen;Pleasant  Thought Process  Coherency WDL  Content Confabulation  Delusions None reported or observed  Perception WDL  Hallucination None reported or observed  Judgment Poor  Confusion None  Danger to Self  Current suicidal ideation? Denies  Self-Injurious Behavior No self-injurious ideation or behavior indicators observed or expressed   Danger to Others  Danger to Others None reported or observed

## 2020-08-10 NOTE — Progress Notes (Incomplete)
[facility] MD Progress Note  Tyrone White  MRN:  [id number]   Chief Complaint: depression  Subjective:  Tyrone White is a 35 y.o. male with a past psychiatric history significant for polysubstance use, who was initially admitted for inpatient psychiatric hospitalization on 08/08/2020 for management of worsening depression and SI in the context of relapse with substances. The patient is currently on Hospital Day 2.   Chart Review from last 24 hours:  The patient's chart was reviewed and nursing notes were reviewed. The patient's case was discussed in multidisciplinary team meeting. Per [facility]   Information Obtained Today During Patient Interview: The patient was seen and evaluated on the unit. On assessment today the patient reports ***  Principal Problem: <principal problem not specified> Diagnosis: Active Problems:   Major depressive disorder, recurrent severe without psychotic features (HCC)  Total Time Spent in Direct Patient Care:  I personally spent *** minutes on the unit in direct patient care. The direct patient care time included face-to-face time with the patient, reviewing the patient's chart, communicating with other professionals, and coordinating care. Greater than 50% of this time was spent in counseling or coordinating care with the patient regarding goals of hospitalization, psycho-education, and discharge planning needs.  Past Psychiatric History: see admission H&P  Past Medical History:  Past Medical History:  Diagnosis Date  . Anxiety   . 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 Depression    History reviewed. No pertinent surgical history. Family History:  Family History  Problem Relation Age of Onset  . Heart disease Mother   . Hypertension Mother   . Diabetes Maternal Uncle   . Prostate cancer Father    Family Psychiatric  History:  See admission H&P  Social History:  Social History   Substance and Sexual  Activity  Alcohol Use Not Currently     Social History   Substance and Sexual Activity  Drug Use Not Currently    Social History   Socioeconomic History  . Marital status: Single    Spouse name: Not on file  . Number of children: Not on file  . Years of education: Not on file  . Highest education level: Not on file  Occupational History  . Not on file  Tobacco Use  . Smoking status: Former Smoker    Types: Cigarettes  . Smokeless tobacco: Never Used  . Tobacco comment: "state that he smokes 3 cigarettes a day"  Vaping Use  . Vaping Use: Never used  Substance and Sexual Activity  . Alcohol use: Not Currently  . Drug use: Not Currently  . Sexual activity: Not Currently  Other Topics Concern  . Not on file  Social History Narrative  . Not on file   Social Determinants of Health   Financial Resource Strain: Not on file  Food Insecurity: Not on file  Transportation Needs: Not on file  Physical Activity: Not on file  Stress: Not on file  Social Connections: Not on file   Additional Social History:    Pain Medications: see MAR Prescriptions: see MAR Over the Counter: see MAR History of alcohol / drug use?: Yes Longest period of sobriety (when/how long): unknown Negative Consequences of Use: Financial,Work / School  Sleep: {BHH GOOD/FAIR/POOR:22877}  Appetite:  {BHH GOOD/FAIR/POOR:22877}  Current Medications: Current Facility-Administered Medications  Medication Dose Route Frequency Provider Last Rate Last Admin  . acamprosate (CAMPRAL) tablet 666 mg  666 mg Oral TID WC Antonieta Pert,  MD   666 mg at 08/10/20 1706  . acetaminophen (TYLENOL) tablet 650 mg  650 mg Oral Q6H PRN Charm Rings, NP   650 mg at 08/09/20 2055  . albuterol (VENTOLIN HFA) 108 (90 Base) MCG/ACT inhaler 1-2 puff  1-2 puff Inhalation Q6H PRN Antonieta Pert, MD      . alum & mag hydroxide-simeth (MAALOX/MYLANTA) 200-200-20 MG/5ML suspension 30 mL  30 mL Oral Q4H PRN Charm Rings,  NP   30 mL at 08/10/20 1602  . busPIRone (BUSPAR) tablet 15 mg  15 mg Oral TID Antonieta Pert, MD   15 mg at 08/10/20 1706  . diphenhydrAMINE (BENADRYL) capsule 50 mg  50 mg Oral Q6H PRN Antonieta Pert, MD   50 mg at 08/10/20 0809  . famotidine (PEPCID) tablet 20 mg  20 mg Oral BID Antonieta Pert, MD   20 mg at 08/10/20 1707  . FLUoxetine (PROZAC) capsule 20 mg  20 mg Oral Daily Antonieta Pert, MD   20 mg at 08/10/20 5993  . folic acid (FOLVITE) tablet 1 mg  1 mg Oral Daily Antonieta Pert, MD   1 mg at 08/10/20 1147  . gabapentin (NEURONTIN) capsule 300 mg  300 mg Oral TID Antonieta Pert, MD   300 mg at 08/10/20 1707  . LORazepam (ATIVAN) tablet 1 mg  1 mg Oral Q6H PRN Antonieta Pert, MD   1 mg at 08/10/20 0409  . magnesium hydroxide (MILK OF MAGNESIA) suspension 30 mL  30 mL Oral Daily PRN Charm Rings, NP   30 mL at 08/09/20 0930  . nicotine (NICODERM CQ - dosed in mg/24 hours) patch 21 mg  21 mg Transdermal Daily Antonieta Pert, MD   21 mg at 08/10/20 1146  . QUEtiapine (SEROQUEL) tablet 300 mg  300 mg Oral QHS Antonieta Pert, MD      . thiamine tablet 100 mg  100 mg Oral Daily Antonieta Pert, MD   100 mg at 08/10/20 5701  . traZODone (DESYREL) tablet 50 mg  50 mg Oral QHS PRN Charm Rings, NP       Lab Results:  Results for orders placed or performed during the hospital encounter of 08/08/20 (from the past 48 hour(s))  Comprehensive metabolic panel     Status: Abnormal   Collection Time: 08/10/20  6:21 PM  Result Value Ref Range   Sodium 139 135 - 145 mmol/L   Potassium 4.1 3.5 - 5.1 mmol/L   Chloride 102 98 - 111 mmol/L   CO2 26 22 - 32 mmol/L   Glucose, Bld 114 (H) 70 - 99 mg/dL    Comment: Glucose reference range applies only to samples taken after fasting for at least 8 hours.   BUN 17 6 - 20 mg/dL   Creatinine, Ser 7.79 0.61 - 1.24 mg/dL   Calcium 39.0 8.9 - 30.0 mg/dL   Total Protein 7.9 6.5 - 8.1 g/dL   Albumin 4.8 3.5 - 5.0 g/dL    AST 32 15 - 41 U/L   ALT 35 0 - 44 U/L   Alkaline Phosphatase 72 38 - 126 U/L   Total Bilirubin 0.3 0.3 - 1.2 mg/dL   GFR, Estimated >92 >33 mL/min    Comment: (NOTE) Calculated using the CKD-EPI Creatinine Equation (2021)    Anion gap 11 5 - 15    Comment: Performed at [facility], [address], [place], [place] [address]  Blood Alcohol level:  Lab Results  Component Value Date   ETH <10 08/07/2020    Metabolic Disorder Labs: Lab Results  Component Value Date   HGBA1C 5.7 (A) 03/02/2020   HGBA1C 5.7 03/02/2020   HGBA1C 5.7 03/02/2020   HGBA1C 5.7 03/02/2020   No results found for: PROLACTIN Lab Results  Component Value Date   CHOL 164 06/23/2020   TRIG 84 06/23/2020   HDL 50 06/23/2020   CHOLHDL 3.3 06/23/2020   LDLCALC 98 06/23/2020   Physical Findings: AIMS: Facial and Oral Movements Muscles of Facial Expression: None, normal Lips and Perioral Area: None, normal Jaw: None, normal Tongue: None, normal,Extremity Movements Upper (arms, wrists, hands, fingers): None, normal Lower (legs, knees, ankles, toes): None, normal, Trunk Movements Neck, shoulders, hips: None, normal, Overall Severity Severity of abnormal movements (highest score from questions above): None, normal Incapacitation due to abnormal movements: None, normal Patient's awareness of abnormal movements (rate only patient's report): No Awareness, Dental Status Current problems with teeth and/or dentures?: No Does patient usually wear dentures?: No  CIWA:  CIWA-Ar Total: 2 COWS:  COWS Total Score: 3  Musculoskeletal: Strength & Muscle Tone: {desc; muscle tone:32375} Gait & Station: {PE GAIT ED QHUT:65465} Patient leans: {Patient Leans:21022755}  Psychiatric Specialty Exam: Physical Exam  Review of Systems  Blood pressure 125/88, pulse (!) 51, temperature 97.8 F (36.6 C), temperature source Oral, resp. rate 18, height 6\' 1"  (1.854 m), SpO2 99 %.Body mass index is 27.71  kg/m.  General Appearance: {Appearance:22683}  Eye Contact:  {BHH EYE CONTACT:22684}  Speech:  {Speech:22685}  Volume:  {Volume (PAA):22686}  Mood:  {BHH MOOD:22306}  Affect:  {Affect (PAA):22687}  Thought Process:  {Thought Process (PAA):22688}  Orientation:  {BHH ORIENTATION (PAA):22689}  Thought Content:  {Thought Content:22690}  Suicidal Thoughts:  {ST/HT (PAA):22692}  Homicidal Thoughts:  {ST/HT (PAA):22692}  Memory:  {BHH MEMORY:22881}  Judgement:  {Judgement (PAA):22694}  Insight:  {Insight (PAA):22695}  Psychomotor Activity:  {Psychomotor (PAA):22696}  Concentration:  {Concentration:21399}  Recall:  {BHH GOOD/FAIR/POOR:22877}  Fund of Knowledge:  {BHH GOOD/FAIR/POOR:22877}  Language:  {BHH GOOD/FAIR/POOR:22877}  Akathisia:  {BHH YES OR NO:22294}  Handed:  {Handed:22697}  AIMS (if indicated):     Assets:  {Assets (PAA):22698}  ADL's:  {BHH  Cognition:  {chl bhh cognition:304700322}  Sleep:  Number of Hours: 6.5   Treatment Plan Summary: ASSESSMENT: Tyrone White is a 35 y.o. male with a past psychiatric history significant for polysubstance use, who was initially admitted for inpatient psychiatric hospitalization on 08/08/2020 for management of worsening depression and SI in the context of relapse with substances.   Diagnoses / Active Problems: Unspecified depressive d/o (r/o substance induced depressive d/o, r/o MDD, r/o bipolar d/o) Unspecified anxiety d/o (r/o GAD) Sedative/hypnotic/anxiolytic use d/o Stimulant use d/o  Cannabis use d/o Alcohol use d/o  PLAN: 1. Safety and Monitoring:  -- Voluntary admission to inpatient psychiatric unit for safety, stabilization and treatment  -- Daily contact with patient to assess and evaluate symptoms and progress in treatment  -- Patient's case to be discussed in multi-disciplinary team meeting  -- Observation Level : q15 minute checks  -- Vital signs:  q12 hours  -- Precautions: suicide  2.  Psychiatric Diagnoses and Treatment:   Unspecified depressive d/o (r/o substance induced depressive d/o, r/o MDD, r/o bipolar d/o)  Unspecified anxiety d/o (r/o GAD)  -- Continue Buspar 15mg  tid for help with anxiety  -- Continue Prozac 20mg  daily for management of anxiety and depressive symptoms  -- Continue  Neurontin 300mg  tid for mood stability, management of anxiety, and potential cravings  -- Continue Seroquel 300mg  po qhs for mood stability and help with sleep -- *** The risks/benefits/side-effects/alternatives to this medication were discussed in detail with the patient and time was given for questions. The patient consents to medication trial.  -- TSH 0.817  -- Metabolic profile and EKG monitoring obtained while on an atypical antipsychotic (BMI:27.17; Lipid Panel from 06/23/20: Cholesterol 164, HDL 50, LDL 98, triglycerides 84; [id number]:pending QTc: pending)   -- Encouraged patient to participate in unit milieu and in scheduled group therapies   -- Short Term Goals: Ability to identify changes in lifestyle to reduce recurrence of condition will improve, Ability to demonstrate self-control will improve and Ability to identify and develop effective coping behaviors will improve  -- Long Term Goals: Improvement in symptoms so as ready for discharge   Sedative/hypnotic/anxiolytic use d/o  Stimulant use d/o   Cannabis use d/o  Alcohol use d/o  -- Continue CIWA protocol for monitoring for potential withdrawal from alcohol or benzodiazepines - continue oral folic acid and thiamine replacement  -- Continue Acamprosate 666mg  tid for alcohol cravings  -- [facility] COVID repeat test is negative  -- Short Term Goals: Ability to identify triggers associated with substance abuse/mental health issues will improve  -- Long Term Goals: Improvement in symptoms so as ready for discharge   3. Medical Issues Being Addressed:   Tobacco Use Disorder  -- Nicotine patch 21mg /24  hours ordered  -- Smoking cessation encouraged   COVID positive  -- currently asymptomatic  -- will repeat COVID test on 7 (08/14/20)   GERD  -- continue Pepcid 20mg  bid   Allergic reaction prior to admission - resolved  -- PRN Benadryl 50mg  q6 hours for return of allergic reaction symptoms   Asthma  -- PRN Albuterol 1-2 puffs q6 hours for asthma symptoms  4. Discharge Planning:   -- Social work and case management to assist with discharge planning and identification of hospital follow-up needs prior to discharge  -- Estimated LOS: TBD  Comer LocketAmy E Truett Mcfarlan, MD, FAPA 08/10/2020, 8:17 PM

## 2020-08-10 NOTE — Progress Notes (Addendum)
Adult Psychoeducational Group Note  Date:  08/10/2020 Time:  12:52 AM  Group Topic/Focus:  Wrap-Up Group:   The focus of this group is to help patients review their daily goal of treatment and discuss progress on daily workbooks.  Participation Level:  Active  Participation Quality:  Appropriate  Affect:  Anxious and Appropriate  Cognitive:  Appropriate  Insight: Limited  Engagement in Group:  Limited  Modes of Intervention:  Discussion  Additional Comments: Pt stated his goal for today was to focus on his treatment plan. Pt stated he accomplished his goal today. Pt stated he was able to speak with his doctor and social worker today about his care.  Pt rated his overall day a 7 out of 10. Pt stated been able to contact his cousin today improved his day.   Pt stated he received all meals today. Pt stated he took all medications provided today. Pt stated he felt better about himself today. Pt stated his appetite was good today. Pt rated sleep last night was fair. Pt stated the goal tonight is to get some rest. Pt stated he was some physical pain today. Pt stated his lower back was in some pain. Pt rated the pain level a 10. Pt nurse was updated on situation. Pt deny visual hallucinations. Pt admitted to experencing some auditory issues today. Pt stated the last time he experienced auditory issues was about ago. Pt nurse was updated on situation. Pt denies thoughts of harming himself or others. Pt stated he would alert staff if anything changes.   Felipa Furnace 08/10/2020, 12:52 AM

## 2020-08-10 NOTE — Progress Notes (Signed)
[facility] MD Progress Note  08/10/2020 3:23 PM Adams Hinch  MRN:  [id number] Subjective: Patient is a 35 year old male with a past psychiatric history significant for polysubstance use disorders who originally presented to the [facility] urgent care clinic secondary to an allergic reaction to an unspecified pill he had taken.  He was then admitted to our facility on 08/10/2019 secondary to relapsing and suicidality.  Objective: Patient is seen and examined.  Patient is a 35 year old male with the above-stated past psychiatric history who is seen in follow-up.  He was seen twice today.  He was seen on interview as well as out the hallway when his anxiety worsened.  He continues to be very med seeking, but recognizes why I am unable to give him benzodiazepines.  I wrote for a now dose of gabapentin, and later in the day when I saw him he felt as though it was somewhat beneficial with regard to his anxiety.  We again discussed issues with regard to returning to the [facility] house.  We discussed the fact that with his positive COVID currently he would have to return to the hotel versus back to [facility] house.  I told him I was concerned that he would relapse, and he is as well very concerned about that.  He tested positive on 08/07/2020.  Social work spoke with [employer], and they stated he would have to test negative before he would be able to return to their facility.  He denied any suicidal or homicidal ideation.  He denied any auditory or visual hallucinations.  He is still bradycardic at a rate of 51.  Blood pressure stable at 125/88.  He had one pulse oximetry at 630 that sat 81%, but the rest of his readings were 98 or 99% on room air.  We will repeat this.  He slept 6.5 hours last night.  No new laboratories he does still need a TSH.  His drug screen on admission was negative.  He will also need a hemoglobin A1c and a lipid panel.  I will take care of that today.  Principal Problem: <principal  problem not specified> Diagnosis: Active Problems:   Major depressive disorder, recurrent severe without psychotic features (HCC)  Total Time spent with patient: 20 minutes  Past Psychiatric History: See admission H&P  Past Medical History:  Past Medical History:  Diagnosis Date  . Anxiety   . 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 Depression    History reviewed. No pertinent surgical history. Family History:  Family History  Problem Relation Age of Onset  . Heart disease Mother   . Hypertension Mother   . Diabetes Maternal Uncle   . Prostate cancer Father    Family Psychiatric  History: See admission H&P Social History:  Social History   Substance and Sexual Activity  Alcohol Use Not Currently     Social History   Substance and Sexual Activity  Drug Use Not Currently    Social History   Socioeconomic History  . Marital status: Single    Spouse name: Not on file  . Number of children: Not on file  . Years of education: Not on file  . Highest education level: Not on file  Occupational History  . Not on file  Tobacco Use  . Smoking status: Former Smoker    Types: Cigarettes  . Smokeless tobacco: Never Used  . Tobacco comment: "state that he smokes 3 cigarettes a day"  Vaping Use  . Vaping Use: Never used  Substance and Sexual Activity  . Alcohol use: Not Currently  . Drug use: Not Currently  . Sexual activity: Not Currently  Other Topics Concern  . Not on file  Social History Narrative  . Not on file   Social Determinants of Health   Financial Resource Strain: Not on file  Food Insecurity: Not on file  Transportation Needs: Not on file  Physical Activity: Not on file  Stress: Not on file  Social Connections: Not on file   Additional Social History:    Pain Medications: see MAR Prescriptions: see MAR Over the Counter: see MAR History of alcohol / drug use?: Yes Longest period of sobriety (when/how long):  unknown Negative Consequences of Use: Financial,Work / School                    Sleep: Good  Appetite:  Good  Current Medications: Current Facility-Administered Medications  Medication Dose Route Frequency Provider Last Rate Last Admin  . acamprosate (CAMPRAL) tablet 666 mg  666 mg Oral TID WC Antonieta Pert, MD   666 mg at 08/10/20 1146  . acetaminophen (TYLENOL) tablet 650 mg  650 mg Oral Q6H PRN Charm Rings, NP   650 mg at 08/09/20 2055  . albuterol (VENTOLIN HFA) 108 (90 Base) MCG/ACT inhaler 1-2 puff  1-2 puff Inhalation Q6H PRN Antonieta Pert, MD      . alum & mag hydroxide-simeth (MAALOX/MYLANTA) 200-200-20 MG/5ML suspension 30 mL  30 mL Oral Q4H PRN Charm Rings, NP   30 mL at 08/10/20 0905  . busPIRone (BUSPAR) tablet 15 mg  15 mg Oral TID Antonieta Pert, MD   15 mg at 08/10/20 1146  . diphenhydrAMINE (BENADRYL) capsule 50 mg  50 mg Oral Q6H PRN Antonieta Pert, MD   50 mg at 08/10/20 0809  . famotidine (PEPCID) tablet 20 mg  20 mg Oral BID Antonieta Pert, MD   20 mg at 08/10/20 0626  . FLUoxetine (PROZAC) capsule 20 mg  20 mg Oral Daily Antonieta Pert, MD   20 mg at 08/10/20 9485  . folic acid (FOLVITE) tablet 1 mg  1 mg Oral Daily Antonieta Pert, MD   1 mg at 08/10/20 1147  . gabapentin (NEURONTIN) capsule 200 mg  200 mg Oral TID Antonieta Pert, MD   200 mg at 08/10/20 1222  . LORazepam (ATIVAN) tablet 1 mg  1 mg Oral Q6H PRN Antonieta Pert, MD   1 mg at 08/10/20 0409  . magnesium hydroxide (MILK OF MAGNESIA) suspension 30 mL  30 mL Oral Daily PRN Charm Rings, NP   30 mL at 08/09/20 0930  . nicotine (NICODERM CQ - dosed in mg/24 hours) patch 21 mg  21 mg Transdermal Daily Antonieta Pert, MD   21 mg at 08/10/20 1146  . QUEtiapine (SEROQUEL) tablet 200 mg  200 mg Oral QHS Charm Rings, NP   200 mg at 08/09/20 2056  . thiamine tablet 100 mg  100 mg Oral Daily Antonieta Pert, MD   100 mg at 08/10/20 4627  . traZODone  (DESYREL) tablet 50 mg  50 mg Oral QHS PRN Charm Rings, NP        Lab Results: No results found for this or any previous visit (from the past 48 hour(s)).  Blood Alcohol level:  Lab Results  Component Value Date   ETH <10 08/07/2020  Metabolic Disorder Labs: Lab Results  Component Value Date   HGBA1C 5.7 (A) 03/02/2020   HGBA1C 5.7 03/02/2020   HGBA1C 5.7 03/02/2020   HGBA1C 5.7 03/02/2020   No results found for: PROLACTIN Lab Results  Component Value Date   CHOL 164 06/23/2020   TRIG 84 06/23/2020   HDL 50 06/23/2020   CHOLHDL 3.3 06/23/2020   LDLCALC 98 06/23/2020    Physical Findings: AIMS: Facial and Oral Movements Muscles of Facial Expression: None, normal Lips and Perioral Area: None, normal Jaw: None, normal Tongue: None, normal,Extremity Movements Upper (arms, wrists, hands, fingers): None, normal Lower (legs, knees, ankles, toes): None, normal, Trunk Movements Neck, shoulders, hips: None, normal, Overall Severity Severity of abnormal movements (highest score from questions above): None, normal Incapacitation due to abnormal movements: None, normal Patient's awareness of abnormal movements (rate only patient's report): No Awareness, Dental Status Current problems with teeth and/or dentures?: No Does patient usually wear dentures?: No  CIWA:  CIWA-Ar Total: 2 COWS:  COWS Total Score: 3  Musculoskeletal: Strength & Muscle Tone: within normal limits Gait & Station: normal Patient leans: N/A  Psychiatric Specialty Exam: Physical Exam Vitals and nursing note reviewed.  Constitutional:      Appearance: Normal appearance.  HENT:     Head: Normocephalic and atraumatic.  Pulmonary:     Effort: Pulmonary effort is normal.  Neurological:     General: No focal deficit present.     Mental Status: He is alert and oriented to person, place, and time.     Review of Systems  Blood pressure 125/88, pulse (!) 51, temperature 97.8 F (36.6 C), temperature  source Oral, resp. rate 18, height 6\' 1"  (1.854 m), SpO2 (!) 81 %.Body mass index is 27.71 kg/m.  General Appearance: Casual  Eye Contact:  Good  Speech:  Pressured  Volume:  Increased  Mood:  Anxious  Affect:  Congruent  Thought Process:  Coherent and Descriptions of Associations: Intact  Orientation:  Full (Time, Place, and Person)  Thought Content:  Logical  Suicidal Thoughts:  No  Homicidal Thoughts:  No  Memory:  Immediate;   Fair Recent;   Fair Remote;   Fair  Judgement:  Intact  Insight:  Fair  Psychomotor Activity:  Increased  Concentration:  Concentration: Fair and Attention Span: Fair  Recall:  of Knowledge:  Fair  Language:  Good  Akathisia:  Negative  Handed:  Right  AIMS (if indicated):     Assets:  Desire for Improvement Resilience  ADL's:  Intact  Cognition:  WNL  Sleep:  Number of Hours: 6.5     Treatment Plan Summary: Daily contact with patient to assess and evaluate symptoms and progress in treatment, Medication management and Plan : Patient is seen and examined.  Patient is a 35 year old male with the above-stated past psychiatric history who is seen in follow-up.   Diagnosis: 1.  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  Generalized anxiety disorder 3.  Polysubstance use disorders 4.  Recent allergic reaction to an unspecified pill he took. 5.  Reported history of asthma. 6.  Positive COVID but asymptomatic  Pertinent findings on examination today: 1.  Continues to have significantly elevated anxiety. 2.  Denied auditory or visual loose [place]. 3.  Denied suicidal or homicidal ideation. 4.  Vital signs are stable and not grossly evident of withdrawal syndromes. 5.  Pulse oximetry on admission and through the course of hospitalization have all been at ninety-eight or 99%.  He has one reading in the 80s.  We will repeat that to make sure that that was an anomaly.  Plan: 1.  Continue BuSpar 15 mg p.o. 3 times daily  for anxiety. 2.  Continue Benadryl 50 mg p.o. every 6 hours as needed any allergic reactions. 3.  Continue Pepcid 20 mg p.o. twice daily for H2 blockade for reflux disease as well as allergic reactions. 4.  Continue fluoxetine 20 mg p.o. daily for anxiety and depression. 5.  Start Neurontin 300 mg p.o. 3 times daily for anxiety and mood stability. 6.  Continue lorazepam 1 mg p.o. every 6 hours as needed a CIWA greater than ten. 7.  Increase Seroquel to 300 mg p.o. nightly for mood stability and sleep. 8.  Continue folic acid and thiamine for nutritional supplementation. 9.  We still need to obtain a TSH, lipid panel, hemoglobin A1c.  I will repeat that in the a.m. tomorrow. 10.  Continue acamprosate 666 mg p.o. 3 times daily with food for alcohol dependence. 11.  Disposition is dependent upon a negative COVID test to be [facility].  I am concerned if we discharged him to the COVID hotel he will most likely relapse, and ended up back at the urgent care center or the emergency room.  We will repeat the COVID test 7 days after his initial positive test. 12.  Disposition planning-as per above.  Antonieta PertGreg Lawson Pariss Hommes, MD 08/10/2020, 3:23 PM

## 2020-08-10 NOTE — Progress Notes (Signed)
   08/10/20 2030  Psych Admission Type (Psych Patients Only)  Admission Status Voluntary  Psychosocial Assessment  Patient Complaints Anxiety;Depression;Restlessness  Eye Contact Fair  Facial Expression Anxious;Pensive  Affect Anxious;Depressed;Sad  Speech Logical/coherent  Interaction Assertive;Needy  Surveyor, quantity;Restless  Appearance/Hygiene Unremarkable  Behavior Characteristics Cooperative;Anxious  Mood Depressed;Anxious;Pleasant  Thought Process  Coherency WDL  Content WDL  Delusions None reported or observed  Perception Hallucinations  Hallucination Auditory (hears whispers)  Judgment Poor  Confusion None  Danger to Self  Current suicidal ideation? Passive  Self-Injurious Behavior No self-injurious ideation or behavior indicators observed or expressed   Agreement Not to Harm Self Yes  Description of Agreement verbal agreement to approach staff  Danger to Others  Danger to Others None reported or observed   Pt seen in hallway. Pt endorses passive SI ("thoughts come from time to time") but contracts for safety. Pt denies HI and endorses AH saying that when he is alone in his room and not doing anything, he hears whispers. They are non-commanding. Pt endorses lower back pain 10/10. "They won't give me Tramadol for it." Pt given Tylenol and hot packs. Pt endorses anxiety 9/10 and depression 9/10.

## 2020-08-10 NOTE — BHH Counselor (Signed)
CSW spoke with Kandis Mannan at [facility] who stated that he believed this patient would have to test negative before returning. Kandis Mannan stated he would contact his supervisor to determine if this patient could return earlier if he currently has no symptoms. CSW will reach out at a later time to determine if he is able to return.   Ruthann Cancer MSW, LCSW Clincal Social Worker  [facility]

## 2020-08-11 MED ORDER — FLUOXETINE HCL 20 MG PO CAPS: 20.0000 mg | ORAL_CAPSULE | Freq: Every day | ORAL | 0 refills | Status: DC

## 2020-08-11 MED ORDER — BUSPIRONE HCL 15 MG PO TABS: 15.0000 mg | ORAL_TABLET | Freq: Three times a day (TID) | ORAL | 0 refills | Status: DC

## 2020-08-11 MED ORDER — QUETIAPINE FUMARATE 300 MG PO TABS: 300.0000 mg | ORAL_TABLET | Freq: Every day | ORAL | 0 refills | Status: DC

## 2020-08-11 MED ORDER — GABAPENTIN 300 MG PO CAPS
300.0000 mg | ORAL_CAPSULE | Freq: Three times a day (TID) | ORAL | 0 refills | Status: DC
Start: 2020-08-11 — End: 2020-08-30

## 2020-08-11 MED ORDER — ACAMPROSATE CALCIUM 333 MG PO TBEC: 666.0000 mg | DELAYED_RELEASE_TABLET | Freq: Three times a day (TID) | ORAL | 0 refills | Status: DC

## 2020-08-11 MED ORDER — FAMOTIDINE 20 MG PO TABS: 20.0000 mg | ORAL_TABLET | Freq: Two times a day (BID) | ORAL | 0 refills | Status: DC

## 2020-08-11 NOTE — Plan of Care (Signed)
Discharge note  Patient verbalizes readiness for discharge. Follow up plan explained, AVS, Transition record and SRA given. Prescriptions and teaching provided. Belongings returned and signed for. Suicide safety plan completed and signed. Patient verbalizes understanding. Patient denies SI/HI and assures this Clinical research associate they will seek assistance should that change. Patient discharged to lobby to wait for transportation.  Problem: Education: Goal: Ability to state activities that reduce stress will improve Outcome: Adequate for Discharge   Problem: Education: Goal: Utilization of techniques to improve thought processes will improve Outcome: Adequate for Discharge Goal: Knowledge of the prescribed therapeutic regimen will improve Outcome: Adequate for Discharge   Problem: Education: Goal: Knowledge of [facility] Education information/materials will improve Outcome: Adequate for Discharge   Problem: Education: Goal: Knowledge of disease or condition will improve 08/11/2020 1235 by Raylene Miyamoto, RN Outcome: Adequate for Discharge 08/11/2020 6433 by Raylene Miyamoto, RN Outcome: Progressing Goal: Understanding of discharge needs will improve 08/11/2020 1235 by Raylene Miyamoto, RN Outcome: Adequate for Discharge 08/11/2020 2951 by Raylene Miyamoto, RN Outcome: Progressing   Problem: Education: Goal: Ability to make informed decisions regarding treatment will improve 08/11/2020 1235 by Raylene Miyamoto, RN Outcome: Adequate for Discharge 08/11/2020 8841 by Raylene Miyamoto, RN Outcome: Progressing

## 2020-08-11 NOTE — Progress Notes (Signed)
  [facility] Adult Case Management Discharge Plan :  Will you be returning to the same living situation after discharge:  Yes,  to [facility] At discharge, do you have transportation home?: Yes,  [facility] to pick this patient up Do you have the ability to pay for your medications: No. Samples to be provided at discharge   Release of information consent forms completed and in the chart;  Patient's signature needed at discharge.  Patient to Follow up at:  Follow-up Information    [facility]. Go on 08/17/2020.   Specialty: Behavioral Health Why: You have a walk in appointment for therapy services on 08/17/20 at 7:45 am.  Walk in appointments are first come, first served.  You also have an appointment for medication management on 08/30/20 at 3:30 pm, in person.  Contact information: [address] [place] [address] [phone]              Next level of care provider has access to [facility] Link:yes  Safety Planning and Suicide Prevention discussed: Yes,  with supervisor at [facility]  Have you used any form of tobacco in the last 30 days? (Cigarettes, Smokeless Tobacco, Cigars, and/or Pipes): Yes  Has patient been referred to the Quitline?: Patient refused referral  Patient has been referred for addiction treatment: Pt. refused referral  Otelia Santee, LCSW 08/11/2020, 10:28 AM

## 2020-08-11 NOTE — Plan of Care (Signed)
  Problem: Education: Goal: Knowledge of disease or condition will improve Outcome: Progressing Goal: Understanding of discharge needs will improve Outcome: Progressing   Problem: Education: Goal: Ability to make informed decisions regarding treatment will improve Outcome: Progressing   

## 2020-08-11 NOTE — BHH Suicide Risk Assessment (Signed)
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 male with a past psychiatric history significant for polysubstance use, who was initially admitted for inpatient psychiatric hospitalization on 08/08/2020 for management of worsening depression and SI in the context of relapse with substances.   On assessment today, the patient states he has a mild stuffy nose but denies CP, SOB, N/V, fever, or other active COVID symptoms. He denies SI, HI, AVH, paranoia, or delusions. He denies medication side-effects. He denies cravings for drugs or alcohol or signs of withdrawal at this time. He is willing to return to [facility] after discharge. He is afebrile with sats 100% today.  Total Time Spent in Direct Patient Care:  I personally spent 25 minutes on the unit in direct patient care. The direct patient care time included face-to-face time with the patient, reviewing the patient's chart, communicating with other professionals, and coordinating care. Greater than 50% of this time was spent in counseling or coordinating care with the patient regarding goals of hospitalization, psycho-education, and discharge planning needs.  Musculoskeletal: Strength & Muscle Tone: within normal limits Gait & Station: normal. steady Patient leans: N/A  Psychiatric Specialty Exam: Review of Systems  Constitutional: Negative for fever.  Respiratory: Negative for cough.   Cardiovascular: Negative for chest pain.  Gastrointestinal: Negative for nausea and vomiting.    Blood pressure (!) 151/86, pulse 99, temperature 98 F (36.7 C), temperature source Oral, resp. rate 18, height 6'  1" (1.854 m), SpO2 100 %.Body mass index is 27.71 kg/m.  General Appearance: casually dressed, well groomed, well engaged with examiner  Eye Contact::  Good  Speech:  Normal Rate and fluency  Volume:  Normal  Mood:  Euthymic  Affect:  Congruent  Thought Process:  Goal Directed and Linear  Orientation:  Full (Time, Place, and Person)  Thought Content:  Denies AVH, SI, HI, ideas of reference, or paranoia - no evidence of acute psychosis on exam  Suicidal Thoughts:  No  Homicidal Thoughts:  No  Memory:  Immediate;   Good  Judgement:  Fair  Insight:  Fair  Psychomotor Activity:  Normal, no akathisias  Concentration:  Good  Recall:  Fair  Fund of Knowledge:Good  Language: Good  Assets:  Communication Skills Desire for Improvement Resilience  Sleep:  Number of Hours: 5.75  Cognition: WNL  ADL's:  Intact   Mental Status Per Nursing Assessment::   On Admission:  Self-harm thoughts  Demographic Factors:  Male and Low socioeconomic status  Loss Factors: Financial problems/change in socioeconomic status  Historical Factors: Prior suicide attempts and Impulsivity  Risk Reduction Factors:   Employed  Continued Clinical Symptoms:  Previous Psychiatric Diagnoses and Treatments  Cognitive Features That Contribute To Risk:  None    Suicide Risk:  Mild:  Suicidal ideation of limited frequency, intensity, duration, and specificity.  There are no identifiable plans, no associated intent, mild dysphoria and related symptoms, good self-control (both objective and subjective assessment), few other risk factors, and identifiable protective factors, including available and accessible social support.   Follow-up Information    [facility] Go on 08/17/2020.   Specialty: Behavioral Health Why: You have a walk in appointment for therapy services on 08/17/20 at  7:45 am.  Walk in appointments are first come, first served.  You also have an appointment for  medication management on 08/30/20 at 3:30 pm, in person.  Contact information: [address] [place] [address] [phone]              Plan Of Care/Follow-up recommendations:  Activity:  as tolerated Diet:  heart healthy Other:  Encouraged compliance with medications and outpatient mental health treatment. He is to resume addictions treatment with [facility] after discharge. He was advised to have metabolic lab, EKG, and weight monitoring while on an antipsychotic after discharge.   Comer Locket, MD, FAPA 08/11/2020, 11:21 AM

## 2020-08-11 NOTE — Discharge Summary (Signed)
Physician Discharge Summary Note  Patient:  Tyrone White is an 35 y.o., male MRN:  [id number] DOB:  07/07/1986 Patient phone:  [phone] (home)  Patient address:   [address] [place] [id number],  Total Time spent with patient: 30 minutes  Date of Admission:  08/08/2020 Date of Discharge: 08/11/2020  Reason for Admission:  (from MD's admission note): Tyrone White is a 35 year old male with a past psychiatric history significant for polysubstance use disorders who [facility] secondary to an allergic reaction to an unspecified pill that he had taken. The patient reported that he was staying at a [facility], and took a friend's "Cogentin and lorazepam". He denied any respiratory issues or swallowing issues but reported trouble speaking at times. He stated his face was having muscle spasms. He was discharged from the urgent care center and sent to the emergency department via EMS. When he arrived at the [facility] emergency department he then reported that he took the benzodiazepines to get high, and that this was "an attempt at self-harm". He reported that he had been at [facility] for approximately a year, and been doing well. He then apparently tested positive for COVID, and was placed in a hotel. Once he was placed in the hotel he began abusing substances again. He told me began drinking alcohol of approximately 6-7 beers a day for a few days. He told the clinic comprehensive clinical assessment team that he had been slipping up recently and drinking more recently. He admitted to poor sleep, racing thoughts. [place] in [place], and that is where all of his family was. He was at some rehabilitation facility prior to coming to [facility]. Previously he stated that he would use methamphetamines, marijuana, alcohol and pills. Apparently he has been seen at the behavioral health urgent  care center with his last visit on 06/29/2020. The only psychotherapy note with any details is from 03/10/2020. At that time he was being treated with Zoloft, hydroxyzine, Seroquel and trazodone. He is a poor historian and unable to really tell me who had prescribed his medications on a regular basis, and despite the notes in the chart that he had been seeing [facility] since early 2021. His main interest right now is getting Ativan. He was admitted to the hospital for evaluation and stabilization.   From MD's progress note 08/10/2020: Patient is seen and examined.  Patient is a 35 year old male with the above-stated past psychiatric history who is seen in follow-up.  He was seen twice today.  He was seen on interview as well as out the hallway when his anxiety worsened.  He continues to be very med seeking, but recognizes why I am unable to give him benzodiazepines.  I wrote for a now dose of gabapentin, and later in the day when I saw him he felt as though it was somewhat beneficial with regard to his anxiety.  We again discussed issues with regard to returning to the [facility].  We discussed the fact that with his positive COVID currently he would have to return to the hotel versus [facility].  I told him I was concerned that he would relapse, and he is as well very concerned about that.  He tested positive on 08/07/2020.  Social work spoke with [facility], and they stated he would have to test negative before he would be able  to return to their facility.  He denied any suicidal or homicidal ideation.  He denied any auditory or visual hallucinations.  He is still bradycardic at a rate of 51.  Blood pressure stable at 125/88.  He had one pulse oximetry at 630 that sat 81%, but the rest of his readings were 98 or 99% on room air.  We will repeat this.  He slept 6.5 hours last night.  No new laboratories he does still need a TSH.  His drug screen  on admission was negative.  He will also need a hemoglobin A1c and a lipid panel.  I will take care of that today.  Evaluation on the unit today, patient is seen, chart reviewed and case discussed with treatment team. Patient is calm and cooperative. He denies suicidal and homicidal ideation. He denies auditory and visual hallucinations. He resides at [facility] and will return there once he is discharged. His vital signs are stable temp 98, Resp 18, 100% room air, BP 151/86, slightly elevated, he is asymptomatic. He has remained calm and cooperative during his hospitalization. He has been housed on the COVID isolation unit and has been attending group therapy and interacting appropriately with staff and peers. He is sleeping and eating well. He slept 5.75 hours last night. He is taking his medications as prescribed and reports no issues or side effects. He feels ready to be discharged and is able to contract for safety. Patient is stable for discharge today.    Principal Problem: <principal problem not specified> Discharge Diagnoses: Active Problems:   Major depressive disorder, recurrent severe without psychotic features [facility])   Past Psychiatric History: Patient admitted to an admission to substance abuse facility somewhere in [place].  He was there for unknown amount of time and then was transferred to the [facility] house for continued long-term treatment.  He previously abused alcohol, cocaine, methamphetamines, marijuana and pills.  Most recently it sounds like its been primarily alcohol and benzodiazepines.  He is unsure who started his current medications and is unsure who is prescribing his current medications.  He has been seen at the [facility] behavioral health urgent care center for therapy.Marland Kitchen  His last visit there was in December 2021.   Past Medical History:  Past Medical History:  Diagnosis Date  . Anxiety   . 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 Depression    History reviewed. No pertinent surgical history. Family History:  Family History  Problem Relation Age of Onset  . Heart disease Mother   . Hypertension Mother   . Diabetes Maternal Uncle   . Prostate cancer Father    Family Psychiatric  History: Unknown Social History:  Social History   Substance and Sexual Activity  Alcohol Use Not Currently     Social History   Substance and Sexual Activity  Drug Use Not Currently    Social History   Socioeconomic History  . Marital status: Single    Spouse name: Not on file  . Number of children: Not on file  . Years of education: Not on file  . Highest education level: Not on file  Occupational History  . Not on file  Tobacco Use  . Smoking status: Former Smoker    Types: Cigarettes  . Smokeless tobacco: Never Used  . Tobacco comment: "state that he smokes 3 cigarettes a day"  Vaping Use  . Vaping Use: Never used  Substance and Sexual  Activity  . Alcohol use: Not Currently  . Drug use: Not Currently  . Sexual activity: Not Currently  Other Topics Concern  . Not on file  Social History Narrative  . Not on file   Social Determinants of Health   Financial Resource Strain: Not on file  Food Insecurity: Not on file  Transportation Needs: Not on file  Physical Activity: Not on file  Stress: Not on file  Social Connections: Not on file    Hospital Course:  Patient was admitted to [facility] for evaluation and stabilization after he relapsed on lorazepam and klonopin. He had been living at [facility] for the past year and was diagnosed with COVID on 1/5 and was sent to a motel. He relapsed and took someone else's pills and had an allergic reaction. He was taken to the emergency room and during his treatment there he voiced suicidal thoughts due to the relapse. He had been clean for one year, his UDS and ETOH were negative on admission.  Patient has been calm and cooperative during his  hospitalization. He has remained safe with 15 minute safety checks. He has had no behavioral issues and is interacting with staff and peers appropriately. He currently denies suicidal and homicidal ideation and denies auditory and visual hallucinations. He is not responding to internal stimuli and denies paranoia. He has been taking his medications and reports no side effects. He lives at [facility] and will return there when discharged. He has been asymptomatic with COVID since admission with the exception of one low pulse ox reading of 81% RA on 1/18. This was repeated and was 98%, all other pulse ox readings have been 98-100% RA. He has no physical complaints. Patient is stable for discharge today.  He remained on the [facility] unit for 2 days. He was started on acamprosate, Buspar, Prozac, and gabapentin. He participated in group therapy on the unit. He responded well to treatment with no adverse effects reported. He has shown improved mood, affect, sleep, and interaction. He denies any SI/HI/AVH and contracts for safety. He is discharging on the medications listed below. He agrees to follow up at [facility] for medication management and therapy.  Patient is provided with a 7 days supply of his medications and written prescriptions for 30 days supply of medications upon discharge. Tyrone White is being picked up by [employer]  for discharge to [facility].   Physical Findings: AIMS: Facial and Oral Movements Muscles of Facial Expression: None, normal Lips and Perioral Area: None, normal Jaw: None, normal Tongue: None, normal,Extremity Movements Upper (arms, wrists, hands, fingers): None, normal Lower (legs, knees, ankles, toes): None, normal, Trunk Movements Neck, shoulders, hips: None, normal, Overall Severity Severity of abnormal movements (highest score from questions above): None, normal Incapacitation due to abnormal movements: None,  normal Patient's awareness of abnormal movements (rate only patient's report): No Awareness, Dental Status Current problems with teeth and/or dentures?: No Does patient usually wear dentures?: No  CIWA:  CIWA-Ar Total: 2 COWS:  COWS Total Score: 3  Musculoskeletal: Strength & Muscle Tone: within normal limits Gait & Station: normal Patient leans: N/A  Psychiatric Specialty Exam: Physical Exam Constitutional:      Appearance: Normal appearance.  HENT:     Head: Normocephalic and atraumatic.  Pulmonary:     Effort: Pulmonary effort is normal.  Musculoskeletal:        General: Normal range of motion.     Cervical back: Normal range of  motion.  Neurological:     General: No focal deficit present.     Mental Status: He is alert and oriented to person, place, and time.  Psychiatric:        Attention and Perception: Attention normal.        Mood and Affect: Mood normal.        Speech: Speech normal.        Behavior: Behavior normal. Behavior is cooperative.        Thought Content: Thought content normal.        Cognition and Memory: Cognition normal.     Review of Systems  Constitutional: Negative for activity change and appetite change.  Respiratory: Negative for chest tightness and shortness of breath.   Cardiovascular: Negative for chest pain.  Gastrointestinal: Negative for abdominal pain.  Neurological: Negative for facial asymmetry and headaches.    Blood pressure (!) 151/86, pulse 99, temperature 98 F (36.7 C), temperature source Oral, resp. rate 18, height 6\' 1"  (1.854 m), SpO2 100 %.Body mass index is 27.71 kg/m.  General Appearance: Casual  Eye Contact:  Good  Speech:  Clear and Coherent and Normal Rate  Volume:  Normal  Mood:  Euthymic  Affect:  Congruent  Thought Process:  Coherent, Goal Directed and Descriptions of Associations: Intact  Orientation:  Full (Time, Place, and Person)  Thought Content:  Logical and Hallucinations: None  Suicidal Thoughts:  No   Homicidal Thoughts:  No  Memory:  Immediate;   Fair Recent;   Fair Remote;   Fair  Judgement:  Intact  Insight:  Fair  Psychomotor Activity:  Normal  Concentration:  Concentration: Fair and Attention Span: Fair  Recall:  Fair  Fund of Knowledge:  Good  Language:  Good  Akathisia:  Negative  Handed:  Right  AIMS (if indicated):     Assets:  Communication Skills Desire for Improvement Housing Resilience Social Support Vocational/Educational  ADL's:  Intact  Cognition:  WNL  Sleep:  Number of Hours: 5.75     Have you used any form of tobacco in the last 30 days? (Cigarettes, Smokeless Tobacco, Cigars, and/or Pipes): Yes  Has this patient used any form of tobacco in the last 30 days? (Cigarettes, Smokeless Tobacco, Cigars, and/or Pipes) Yes, Yes, Prescription not provided because: Patient declined smoking cessation education and treatment at discharge  Blood Alcohol level:  Lab Results  Component Value Date   ETH <10 08/07/2020    Metabolic Disorder Labs:  Lab Results  Component Value Date   HGBA1C 5.7 (A) 03/02/2020   HGBA1C 5.7 03/02/2020   HGBA1C 5.7 03/02/2020   HGBA1C 5.7 03/02/2020   No results found for: PROLACTIN Lab Results  Component Value Date   CHOL 164 06/23/2020   TRIG 84 06/23/2020   HDL 50 06/23/2020   CHOLHDL 3.3 06/23/2020   LDLCALC 98 06/23/2020    See Psychiatric Specialty Exam and Suicide Risk Assessment completed by Attending Physician prior to discharge.  Discharge destination:  Other:  [facility]  Is patient on multiple antipsychotic therapies at discharge:  No   Has Patient had three or more failed trials of antipsychotic monotherapy by history:  No  Recommended Plan for Multiple Antipsychotic Therapies: NA   Allergies as of 08/11/2020   No Known Allergies     Medication List    STOP taking these medications   hyoscyamine 0.125 MG SL tablet Commonly known as: LEVSIN SL   pantoprazole 40 MG tablet Commonly known as:  PROTONIX  TAKE these medications     Indication  acamprosate 333 MG tablet Commonly known as: CAMPRAL Take 2 tablets (666 mg total) by mouth 3 (three) times daily with meals.  Indication: Abuse or Misuse of Alcohol   Artificial Tears 1.4 % ophthalmic solution Generic drug: polyvinyl alcohol Place 1 drop into both eyes as needed for dry eyes.  Indication: Dry eyes   busPIRone 15 MG tablet Commonly known as: BUSPAR Take 1 tablet (15 mg total) by mouth 3 (three) times daily. What changed:   medication strength  how much to take  Indication: Anxiety Disorder   famotidine 20 MG tablet Commonly known as: PEPCID Take 1 tablet (20 mg total) by mouth 2 (two) times daily.  Indication: Heartburn   FLUoxetine 20 MG capsule Commonly known as: PROZAC Take 1 capsule (20 mg total) by mouth daily. Start taking on: August 12, 2020  Indication: Major Depressive Disorder   gabapentin 300 MG capsule Commonly known as: NEURONTIN Take 1 capsule (300 mg total) by mouth 3 (three) times daily.  Indication: Abuse or Misuse of Alcohol   QUEtiapine 300 MG tablet Commonly known as: SEROQUEL Take 1 tablet (300 mg total) by mouth at bedtime. What changed:   medication strength  how much to take  Indication: Major Depressive Disorder       Follow-up Information    [facility]. Go on 08/17/2020.   Specialty: Behavioral Health Why: You have a walk in appointment for therapy services on 08/17/20 at 7:45 am.  Walk in appointments are first come, first served.  You also have an appointment for medication management on 08/30/20 at 3:30 pm, in person.  Contact information: [address] [place] [address] [phone]              Follow-up recommendations:  Activity:  as tolerated Diet:  Heart Healthy  Comments:  Patient is instructed prior to discharge to:  Take all medications as prescribed by his/her mental healthcare provider. Report any  adverse effects and or reactions from the medicines to his/her outpatient provider promptly. Patient has been instructed & cautioned: To not engage in alcohol and or illegal drug use while on prescription medicines. In the event of worsening symptoms, patient is instructed to call the crisis hotline, 911 and or go to the nearest ED for appropriate evaluation and treatment of symptoms. To follow-up with his/her primary care provider for your other medical issues, concerns and or health care needs.  Signed: Laveda Abbe, NP 08/11/2020, 10:16 AM

## 2020-08-16 ENCOUNTER — Other Ambulatory Visit (HOSPITAL_COMMUNITY): Payer: Self-pay | Admitting: Psychiatry

## 2020-08-16 DIAGNOSIS — F330 Major depressive disorder, recurrent, mild: Secondary | ICD-10-CM

## 2020-08-16 DIAGNOSIS — F411 Generalized anxiety disorder: Secondary | ICD-10-CM

## 2020-08-25 ENCOUNTER — Other Ambulatory Visit: Payer: Self-pay

## 2020-08-25 ENCOUNTER — Ambulatory Visit (INDEPENDENT_AMBULATORY_CARE_PROVIDER_SITE_OTHER): Payer: No Payment, Other | Admitting: Licensed Clinical Social Worker

## 2020-08-25 DIAGNOSIS — F411 Generalized anxiety disorder: Secondary | ICD-10-CM

## 2020-08-28 NOTE — Progress Notes (Signed)
   THERAPIST PROGRESS NOTE  Session Time: 45 min  Participation Level: Active  Behavioral Response: CasualAlertNegative, Anxious and Depressed  Type of Therapy: Individual Therapy  Treatment Goals addressed: Communication: Anx/Dep/Coping  Interventions: CBT, Supportive and Reframing  Summary: Tyrone White is a 35 y.o. male who presents with hx of GAD. This date pt comes for in person session. Pt reports he missed last session as there was COVID exposure in the household and all quarantined. He denies getting COVID. Pt reports he was on quarantine for his birthday when LCSW acknowledged birthday. Pt presents as more depressed this date as evidenced by low, slow speech and slumped posture. Pt confirms increased feelings of dep when asked. LCSW assessed for status of med management. Pt states he is not taking his Sertraline. He feels the med was making him feel "worse", confirms feelings of paranoia/voices. Sleep is very poor per his report waking up multiple times per night. LCSW assessed for use of alcohol/drugs. Pt first denies all. With additional discussion, pt admits to drinking almost daily. Reports he is drinking beer and he is the one seeking it out. He has also started back with nicotine/cigarettes. Denies cannabis or any other drugs. Pt reports feeling like "a failure". He is frustrated he has not been able to get car title (reports title lost) and is "working like a dog for free". LCSW assisted to process thoughts feelings and reframed as indicated. He states he continues to think about his mother and states regret, "I was doing bad when she died". This was addressed in some depth with education on trying letter writing to express regrets/pain externally. Pt agrees to try strategy. Pt states his longest sobriety was 6 mon "but that was when I was incarcerated". He agrees he could have obtained substances at that time and didn't. Remainder of session spent addressing all coping  strategies and self talk. Pt has med management appt 02/07 and agrees for LCSW to share details of pt's overall circumstances/feelings with provider. LCSW reviewed poc including scheduling prior to close of session. Pt states appreciation for care. Endorses more hope post session.      Suicidal/Homicidal: Nowithout intent/plan  Therapist Response: Pt remains receptive to care.  Plan: Return again in ~4 weeks.  Diagnosis: Axis I: Generalized Anxiety Disorder    Axis II: Deferred  [place], LCSW 08/28/2020

## 2020-08-30 ENCOUNTER — Encounter (HOSPITAL_COMMUNITY): Payer: Self-pay | Admitting: Psychiatry

## 2020-08-30 ENCOUNTER — Other Ambulatory Visit: Payer: Self-pay

## 2020-08-30 ENCOUNTER — Ambulatory Visit (INDEPENDENT_AMBULATORY_CARE_PROVIDER_SITE_OTHER): Payer: No Payment, Other | Admitting: Psychiatry

## 2020-08-30 VITALS — BP 134/87 | HR 84 | Ht 73.0 in | Wt 200.8 lb

## 2020-08-30 DIAGNOSIS — F1020 Alcohol dependence, uncomplicated: Secondary | ICD-10-CM

## 2020-08-30 DIAGNOSIS — F330 Major depressive disorder, recurrent, mild: Secondary | ICD-10-CM | POA: Diagnosis not present

## 2020-08-30 DIAGNOSIS — F411 Generalized anxiety disorder: Secondary | ICD-10-CM | POA: Diagnosis not present

## 2020-08-30 MED ORDER — QUETIAPINE FUMARATE 300 MG PO TABS: 300.0000 mg | ORAL_TABLET | Freq: Every day | ORAL | 2 refills | Status: DC

## 2020-08-30 MED ORDER — HYDROXYZINE HCL 50 MG PO TABS: 50.0000 mg | ORAL_TABLET | Freq: Three times a day (TID) | ORAL | 2 refills | Status: DC | PRN

## 2020-08-30 MED ORDER — MIRTAZAPINE 15 MG PO TABS: 15.0000 mg | ORAL_TABLET | Freq: Every day | ORAL | 2 refills | Status: DC

## 2020-08-30 MED ORDER — BUSPIRONE HCL 15 MG PO TABS
15.0000 mg | ORAL_TABLET | Freq: Three times a day (TID) | ORAL | 2 refills | Status: DC
Start: 2020-08-30 — End: 2020-11-09

## 2020-08-30 MED ORDER — GABAPENTIN 300 MG PO CAPS: 300.0000 mg | ORAL_CAPSULE | Freq: Three times a day (TID) | ORAL | 2 refills | Status: DC

## 2020-08-30 MED ORDER — ACAMPROSATE CALCIUM 333 MG PO TBEC: 666.0000 mg | DELAYED_RELEASE_TABLET | Freq: Three times a day (TID) | ORAL | 2 refills | Status: DC

## 2020-08-30 NOTE — Progress Notes (Signed)
BH MD/PA/NP OP Progress Note  08/30/2020 4:36 PM Tyrone White  MRN:  [id number]  Chief Complaint: "I have a lot of anxiety" Chief Complaint    Follow-up      HPI: 35 year old male seen today for follow up psychiatric evaluation.   He has a psychiatric history of anxiety, depression, marijuana use disorder, and methamphetamine use disorder.  Patient was recently hospitalized on 08/08/2020-08/11/2020  for evaluation and stabilization after he relapsed on lorazepam and klonopin. Per note patient he "was  diagnosed with COVID on 1/5 and was sent to a motel. He relapsed and took someone else's pills and had an allergic reaction. He was taken to the emergency room and during his treatment there he voiced suicidal thoughts due to the relapse". He is now being managed on Prozac 20 mg daily,  hydroxyzine 25 mg three times a day, Seroquel 300 mg at bedtime,Gabapentin 300 mg three times daily, acamprosate 666 mg three times daily and trazodone 25-50 mg at bedtime.   Patient reports that he discontinued her Trazodone because it caused him to have bad dreams and notes he never started Prozac.  Today he is well-groomed, pleasant, cooperative, engaged in conversation, and maintained eye contact.  He informed provider that since his hospitalization he has been more anxious and depressed. 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  Provider conducted a PHQ-9 and patient scored a 15, at his last visit he scored a 3.  Provider also conducted a GAD-7 and patient scored a 15, at his last visit he scored a 9.  Patient notes that his sleep and appetite are poor. He also notes that he has lost weight and wants to regain weight. He denies SI/HI/VAH or paranoia. Provider conducted an AUDIT and patient scored a 16.  Patient is agreeable to starting Mirtazapine as he notes he never started Prozac and needs assistance managiing his appetite, weight,  and sleep.  He notes that he did not discontinue hydroxyzine at his last visit and is agreeable to increasing hydroxyzine 25 mg three times daily to 50 mg three times daily to help manage anxiety. He will continue all other medications as prescribed. Patient will follow-up with outpatient therapist for counseling.  No other concerns noted at this time.    Visit Diagnosis:    ICD-10-CM   1. Alcohol use disorder, severe, dependence (HCC)  F10.20 gabapentin (NEURONTIN) 300 MG capsule    acamprosate (CAMPRAL) 333 MG tablet  2. Generalized anxiety disorder  F41.1 QUEtiapine (SEROQUEL) 300 MG tablet    busPIRone (BUSPAR) 15 MG tablet    mirtazapine (REMERON) 15 MG tablet    hydrOXYzine (ATARAX/VISTARIL) 50 MG tablet    gabapentin (NEURONTIN) 300 MG capsule  3. Mild episode of recurrent major depressive disorder (HCC)  F33.0 QUEtiapine (SEROQUEL) 300 MG tablet    mirtazapine (REMERON) 15 MG tablet    Past Psychiatric History: anxiety, depression, marijuana use disorder, and methamphetamine use disorder.  Past Medical History:  Past Medical History:  Diagnosis Date  . Anxiety   . 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 Depression    No past surgical history on file.  Family Psychiatric History: Maternal aunts and uncles substance use.   Family History:  Family History  Problem Relation Age of Onset  . Heart disease Mother   . Hypertension Mother   . Diabetes Maternal Uncle   . Prostate cancer  Father     Social History:  Social History   Socioeconomic History  . Marital status: Single    Spouse name: Not on file  . Number of children: Not on file  . Years of education: Not on file  . Highest education level: Not on file  Occupational History  . Not on file  Tobacco Use  . Smoking status: Former Smoker    Types: Cigarettes  . Smokeless tobacco: Never Used  . Tobacco comment: "state that he smokes 3 cigarettes a day"  Vaping Use  .  Vaping Use: Never used  Substance and Sexual Activity  . Alcohol use: Not Currently  . Drug use: Not Currently  . Sexual activity: Not Currently  Other Topics Concern  . Not on file  Social History Narrative  . Not on file   Social Determinants of Health   Financial Resource Strain: Not on file  Food Insecurity: Not on file  Transportation Needs: Not on file  Physical Activity: Not on file  Stress: Not on file  Social Connections: Not on file    Allergies: No Known Allergies  Metabolic Disorder Labs: Lab Results  Component Value Date   HGBA1C 5.7 (A) 03/02/2020   HGBA1C 5.7 03/02/2020   HGBA1C 5.7 03/02/2020   HGBA1C 5.7 03/02/2020   No results found for: PROLACTIN Lab Results  Component Value Date   CHOL 164 06/23/2020   TRIG 84 06/23/2020   HDL 50 06/23/2020   CHOLHDL 3.3 06/23/2020   LDLCALC 98 06/23/2020   Lab Results  Component Value Date   TSH 0.817 08/10/2020   TSH 1.380 06/23/2020    Therapeutic Level Labs: No results found for: LITHIUM No results found for: VALPROATE No components found for:  CBMZ  Current Medications: Current Outpatient Medications  Medication Sig Dispense Refill  . famotidine (PEPCID) 20 MG tablet Take 1 tablet (20 mg total) by mouth 2 (two) times daily. 60 tablet 0  . hydrOXYzine (ATARAX/VISTARIL) 50 MG tablet Take 1 tablet (50 mg total) by mouth 3 (three) times daily as needed. 90 tablet 2  . mirtazapine (REMERON) 15 MG tablet Take 1 tablet (15 mg total) by mouth at bedtime. 30 tablet 2  . polyvinyl alcohol (LIQUIFILM TEARS) 1.4 % ophthalmic solution Place 1 drop into both eyes as needed for dry eyes.    Marland Kitchen acamprosate (CAMPRAL) 333 MG tablet Take 2 tablets (666 mg total) by mouth 3 (three) times daily with meals. 180 tablet 2  . busPIRone (BUSPAR) 15 MG tablet Take 1 tablet (15 mg total) by mouth 3 (three) times daily. 90 tablet 2  . gabapentin (NEURONTIN) 300 MG capsule Take 1 capsule (300 mg total) by mouth 3 (three) times  daily. 90 capsule 2  . QUEtiapine (SEROQUEL) 300 MG tablet Take 1 tablet (300 mg total) by mouth at bedtime. 30 tablet 2   No current facility-administered medications for this visit.     Musculoskeletal: Strength & Muscle Tone: within normal limits Gait & Station: normal Patient leans: N/A  Psychiatric Specialty Exam: Review of Systems  Blood pressure 134/87, pulse 84, height 6\' 1"  (1.854 m), weight 200 lb 12.8 oz (91.1 kg), SpO2 98 %.Body mass index is 26.49 kg/m.  General Appearance: Well Groomed  Eye Contact:  Good  Speech:  Clear and Coherent and Normal Rate  Volume:  Normal  Mood:  Anxious and Dysphoric  Affect:  Appropriate and Congruent  Thought Process:  Coherent, Goal Directed and Linear  Orientation:  Full (Time,  Place, and Person)  Thought Content: WDL and Logical   Suicidal Thoughts:  No  Homicidal Thoughts:  No  Memory:  Immediate;   Good Recent;   Good Remote;   Good  Judgement:  Good  Insight:  Good  Psychomotor Activity:  Normal  Concentration:  Concentration: Good and Attention Span: Good  Recall:  Good  Fund of Knowledge: Good  Language: Good  Akathisia:  No  Handed:  Right  AIMS (if indicated): Not done  Assets:  Communication Skills Desire for Improvement Financial Resources/Insurance Housing Social Support  ADL's:  Intact  Cognition: WNL  Sleep:  Fair   Screenings: AIMS   Flowsheet Row [facility]  AIMS Total Score 0    AUDIT   Flowsheet Row Clinical Support from 08/30/2020 in [facility] (Dis[facility]  Alcohol Use Disorder Identification Test Final Score (AUDIT) 16 32    GAD-7   Flowsheet Row Clinical Support from 08/30/2020 in [facility] Clinical Support from 06/09/2020 in [facility]  Total GAD-7 Score 15  9    PHQ2-9   Flowsheet Row Clinical Support from 08/30/2020 in [facility] ED from 08/06/2020 in [facility] EMERGENCY DEPART[facility] Clinical Support from 06/09/2020 in [facility] Office Visit from 03/02/2020 in [facility]  PHQ-2 Total Score 4 5 0 0  PHQ-9 Total Score 15 16 3  -    Flowsheet Row [facility]  C-SSRS RISK CATEGORY No Risk       Assessment and Plan: Patient endorses symptoms of anxiety, depression, and disturbed sleep. Patient is agreeable to starting Mirtazapine as he notes he never started Prozac and needs assistance managiing his appetite, weight, and sleep.  He notes that he did not discontinue hydroxyzine at his last visit and is agreeable to increasing hydroxyzine 25 mg three times daily to 50 mg three times daily to help manage anxietFollow-up in 2 months  1. Alcohol use disorder, severe, dependence (HCC)  Continue- gabapentin (NEURONTIN) 300 MG capsule; Take 1 capsule (300 mg total) by mouth 3 (three) times daily.  Dispense: 90 capsule; Refill: 2 Continue- acamprosate (CAMPRAL) 333 MG tablet; Take 2 tablets (666 mg total) by mouth 3 (three) times daily with meals.  Dispense: 180 tablet; Refill: 2  2. Generalized anxiety disorder  Continue- QUEtiapine (SEROQUEL) 300 MG tablet; Take 1 tablet (300 mg total) by mouth at bedtime.  Dispense: 30 tablet; Refill: 2 Continue- busPIRone (BUSPAR) 15 MG tablet; Take 1 tablet (15 mg total) by mouth 3 (three) times daily.  Dispense: 90 tablet; Refill: 2 Start- mirtazapine (REMERON) 15 MG tablet; Take 1 tablet (15 mg total) by mouth at bedtime.  Dispense: 30 tablet; Refill: 2 Continue- hydrOXYzine (ATARAX/VISTARIL) 50 MG tablet; Take 1 tablet (50 mg total) by mouth 3 (three) times daily as needed.  Dispense: 90 tablet; Refill: 2 Continue- gabapentin (NEURONTIN) 300 MG  capsule; Take 1 capsule (300 mg total) by mouth 3 (three) times daily.  Dispense: 90 capsule; Refill: 2  3. Mild episode of recurrent major depressive disorder (HCC)  Continue- QUEtiapine (SEROQUEL) 300 MG tablet; Take 1 tablet (300 mg total) by mouth at bedtime.  Dispense: 30 tablet; Refill: 2 Start- mirtazapine (REMERON) 15 MG tablet; Take 1 tablet (15 mg total) by mouth  at bedtime.  Dispense: 30 tablet; Refill: 2   Follow up in 3 months Follow-up with therapy Shanna Cisco, NP 08/30/2020, 4:36 PM

## 2020-08-31 ENCOUNTER — Telehealth (HOSPITAL_COMMUNITY): Payer: Self-pay | Admitting: Psychiatry

## 2020-08-31 ENCOUNTER — Telehealth (HOSPITAL_COMMUNITY): Payer: Self-pay | Admitting: *Deleted

## 2020-08-31 ENCOUNTER — Other Ambulatory Visit (HOSPITAL_COMMUNITY): Payer: Self-pay | Admitting: Psychiatry

## 2020-08-31 ENCOUNTER — Telehealth: Payer: Self-pay | Admitting: Family Medicine

## 2020-08-31 DIAGNOSIS — F1020 Alcohol dependence, uncomplicated: Secondary | ICD-10-CM

## 2020-08-31 MED ORDER — NALTREXONE HCL 50 MG PO TABS: 50.0000 mg | ORAL_TABLET | Freq: Every day | ORAL | 2 refills | Status: DC

## 2020-08-31 NOTE — Telephone Encounter (Signed)
[facility] CALLED & STATED THAT Tyrone White IS UNDER THE IMPRESSION THAT PATIENT IS STILL ON PROZAC?? ALTHOUGH YOU HAVE ORDERED SERTRALINE 08-19-20.  AND THAT THE CAMPRAL ORDERED PATIENT HAS NO INSURANCE & THE MED COST IS  $60 & IS UNAFFORDABLE. SUGGESTED ALTERNATIVE IS NALTREXONE

## 2020-08-31 NOTE — Telephone Encounter (Signed)
Provider called [facility] to verify patient medications. Patient informed  Provider on 08/31/2020 that he never started Prozac. Patient discontinued Zoloft a few month ago. He was prescribed Mirtazapine 15 mg nightly on 08/31/2020 to help manage his sleep, appetite, and weight. Per [employer] patient insurance does not cover acamposate. Provider discontinued acamposate and started patient on Naltrexone 50 mg daily.

## 2020-08-31 NOTE — Telephone Encounter (Signed)
Called and spoke w/ pharmacist , informed her that  Tyrone White wasn't written an medication for him that his medication is coming from behavior health , and other provider, If the patient need an medication from [place] he will need to make an appt.

## 2020-09-22 ENCOUNTER — Ambulatory Visit (INDEPENDENT_AMBULATORY_CARE_PROVIDER_SITE_OTHER): Payer: No Payment, Other | Admitting: Psychiatry

## 2020-09-22 ENCOUNTER — Encounter (HOSPITAL_COMMUNITY): Payer: Self-pay | Admitting: Psychiatry

## 2020-09-22 ENCOUNTER — Ambulatory Visit (INDEPENDENT_AMBULATORY_CARE_PROVIDER_SITE_OTHER): Payer: No Payment, Other | Admitting: Licensed Clinical Social Worker

## 2020-09-22 ENCOUNTER — Other Ambulatory Visit: Payer: Self-pay

## 2020-09-22 DIAGNOSIS — F411 Generalized anxiety disorder: Secondary | ICD-10-CM

## 2020-09-22 DIAGNOSIS — F330 Major depressive disorder, recurrent, mild: Secondary | ICD-10-CM | POA: Diagnosis not present

## 2020-09-22 DIAGNOSIS — F1020 Alcohol dependence, uncomplicated: Secondary | ICD-10-CM

## 2020-09-22 MED ORDER — MIRTAZAPINE 30 MG PO TABS
30.0000 mg | ORAL_TABLET | Freq: Every day | ORAL | 2 refills | Status: DC
Start: 2020-09-22 — End: 2020-11-25

## 2020-09-22 MED ORDER — NALTREXONE HCL 50 MG PO TABS: 50.0000 mg | ORAL_TABLET | Freq: Every day | ORAL | 2 refills | Status: DC

## 2020-09-22 NOTE — Progress Notes (Signed)
BH MD/PA/NP OP Progress Note  09/22/2020 10:51 AM Tyrone White  MRN:  [id number]  Chief Complaint: "I not been sleeping"   HPI: 35 yo male seen today for follow up psychiatric evaluation. Patient has psychiatric hx of anxiety, depression, marijuana use disorder, and methamphetamine use disorder. He is currently managed on Mirtazepine 15 mg, Seroquel 300 mg nightly, Naltrexone 50 mg daily, Gabapentin 300 mg TID, and Buspar 15 mg TID. He notes that his medications are somewhat effective in managing his psychiatric conditions, however notes he has been having poor sleep and notes that he would like to discontinue some of his medications.   Today he is well groomed, pleasant, cooperative, and engaged in conversation. 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  Provider conducted a GAD7 and patient scored 18, provider also conducted PHQ9 and patient scored 15.   Patient endorses he has been sleeping poorly, noting for last 2 weeks he has been waking up nightly q2hr. He denies any exacerbating events that would contribute to changes in sleep hygiene. Patient also notes he has been losing weight unintentionally within the last month, attributing it to increased anxiety. Patient notes he has "never done this good before, I don't want to fail."  In addition to these complaints, patient notes new onset of L sided groin pain x 2 weeks prior which he attributes to a strain from working at [employer]. Patient describes pain as aching and intermittent, rating pain 3/10. He notes he has f/u appointment with GI tomorrow for further evaluation and treatment. Patient denied further complaints as well as SI/HI/VAH.  Patient is agreeable to increasing Mirtazepine from 15 mg to 30 mg to alleviate anxiety, decreased appetite, and poor sleep. Patient also notes he would like to discontinue Gabapentin and  hydroxyzine, stating he feels he is on too many medications. Before discontinuing, this provider educated patient that Gabapentin aids in treatment of anxiety, mood stabilization, and pain. Patient displayed understanding however still elected to discontinue.  He will continue all other medications as prescribed and follow-up with outpatient therapy.  Patient notes that at this time he does not need a refill of Seroquel or BuSpar.  No other concerns at this time.  Visit Diagnosis:    ICD-10-CM   1. Generalized anxiety disorder  F41.1 mirtazapine (REMERON) 30 MG tablet  2. Mild episode of recurrent major depressive disorder (HCC)  F33.0 mirtazapine (REMERON) 30 MG tablet  3. Alcohol use disorder, severe, dependence (HCC)  F10.20 naltrexone (DEPADE) 50 MG tablet    Past Psychiatric History: anxiety, depression, marijuana use disorder, and methamphetamine use disorder.  Past Medical History:  Past Medical History:  Diagnosis Date  . Anxiety   . 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 Depression    No past surgical history on file.  Family Psychiatric History: Maternal aunts and uncles substance use.   Family History:  Family History  Problem Relation Age of Onset  . Heart disease Mother   . Hypertension Mother   . Diabetes Maternal Uncle   . Prostate cancer Father     Social History:  Social History   Socioeconomic History  . Marital status: Single    Spouse name: Not on file  . Number of children: Not on file  . Years of education: Not on file  . Highest education level: Not on file  Occupational History  . Not on file  Tobacco Use  . Smoking status: Former Smoker    Types: Cigarettes  . Smokeless tobacco: Never Used  . Tobacco comment: "state that he smokes 3 cigarettes a day"  Vaping Use  . Vaping Use: Never used  Substance and Sexual Activity  . Alcohol use: Not Currently  . Drug use: Not Currently  . Sexual activity: Not  Currently  Other Topics Concern  . Not on file  Social History Narrative  . Not on file   Social Determinants of Health   Financial Resource Strain: Not on file  Food Insecurity: Not on file  Transportation Needs: Not on file  Physical Activity: Not on file  Stress: Not on file  Social Connections: Not on file    Allergies: No Known Allergies  Metabolic Disorder Labs: Lab Results  Component Value Date   HGBA1C 5.7 (A) 03/02/2020   HGBA1C 5.7 03/02/2020   HGBA1C 5.7 03/02/2020   HGBA1C 5.7 03/02/2020   No results found for: PROLACTIN Lab Results  Component Value Date   CHOL 164 06/23/2020   TRIG 84 06/23/2020   HDL 50 06/23/2020   CHOLHDL 3.3 06/23/2020   LDLCALC 98 06/23/2020   Lab Results  Component Value Date   TSH 0.817 08/10/2020   TSH 1.380 06/23/2020    Therapeutic Level Labs: No results found for: LITHIUM No results found for: VALPROATE No components found for:  CBMZ  Current Medications: Current Outpatient Medications  Medication Sig Dispense Refill  . busPIRone (BUSPAR) 15 MG tablet Take 1 tablet (15 mg total) by mouth 3 (three) times daily. 90 tablet 2  . famotidine (PEPCID) 20 MG tablet Take 1 tablet (20 mg total) by mouth 2 (two) times daily. 60 tablet 0  . gabapentin (NEURONTIN) 300 MG capsule Take 1 capsule (300 mg total) by mouth 3 (three) times daily. 90 capsule 2  . mirtazapine (REMERON) 30 MG tablet Take 1 tablet (30 mg total) by mouth at bedtime. 30 tablet 2  . naltrexone (DEPADE) 50 MG tablet Take 1 tablet (50 mg total) by mouth daily. 30 tablet 2  . polyvinyl alcohol (LIQUIFILM TEARS) 1.4 % ophthalmic solution Place 1 drop into both eyes as needed for dry eyes.    Marland Kitchen QUEtiapine (SEROQUEL) 300 MG tablet Take 1 tablet (300 mg total) by mouth at bedtime. 30 tablet 2   No current facility-administered medications for this visit.     Musculoskeletal: Strength & Muscle Tone: within normal limits Gait & Station: normal Patient leans:  N/A  Psychiatric Specialty Exam: Review of Systems  There were no vitals taken for this visit.There is no height or weight on file to calculate BMI.  General Appearance: Well Groomed  Eye Contact:  Good  Speech:  Clear and Coherent and Normal Rate  Volume:  Normal  Mood:  Anxious and Dysphoric  Affect:  Appropriate and Congruent  Thought Process:  Coherent, Goal Directed and Linear  Orientation:  Full (Time, Place, and Person)  Thought Content: WDL and Logical   Suicidal Thoughts:  No  Homicidal Thoughts:  No  Memory:  Immediate;   Good Recent;   Good Remote;   Good  Judgement:  Good  Insight:  Good  Psychomotor Activity:  Normal  Concentration:  Concentration: Good and Attention Span: Good  Recall:  Good  Fund of Knowledge: Good  Language: Good  Akathisia:  No  Handed:  Right  AIMS (if indicated): Not done  Assets:  Communication Skills  Desire for Improvement Financial Resources/Insurance Housing Social Support  ADL's:  Intact  Cognition: WNL  Sleep:  Poor   Screenings: AIMS   Flowsheet Row [facility]  AIMS Total Score 0    AUDIT   Flowsheet Row Clinical Support from 08/30/2020 in [facility] Admission (Discharged[facility]  Alcohol Use Disorder Identification Test Final Score (AUDIT) 16 32    GAD-7   Flowsheet Row Office Visit from 09/22/2020 in [facility] Clinical Support from 08/30/2020 in [facility] Clinical Support from 06/09/2020 in [facility]  Total GAD-7 Score 18 15 9     PHQ2-9   Flowsheet Row Office Visit from 09/22/2020 in [facility] Clinical Support from 08/30/2020 in [facility] ED from 08/06/2020 in [facility] Clinical  Support from 06/09/2020 in [facility] Office Visit from 03/02/2020 in [facility]  PHQ-2 Total Score 5 4 5  0 0  PHQ-9 Total Score 15 15 16 3  --    Flowsheet Row Office Visit from 09/22/2020 in [facility] Admission (Discharged[facility]  C-SSRS RISK CATEGORY Error: Q3, 4, or 5 should not be populated when Q2 is No No Risk       Assessment and Plan: Patient endorses symptoms of anxiety, depression, and disturbed sleep. Patient is agreeable to increasing Mirtazepine from 15 mg to 30 mg to alleviate anxiety, decreased appetite, and poor sleep. Patient also notes he would like to discontinue Gabapentin and hydroxyzine, stating he feels he is on too many medications.  At this time he notes that he does not need a refill of Seroquel or BuSpar.  He will continue all other medications as prescribed.  1. Generalized anxiety disorder  Increased- mirtazapine (REMERON) 30 MG tablet; Take 1 tablet (30 mg total) by mouth at bedtime.  Dispense: 30 tablet; Refill: 2  2. Mild episode of recurrent major depressive disorder (HCC)  Increased- mirtazapine (REMERON) 30 MG tablet; Take 1 tablet (30 mg total) by mouth at bedtime.  Dispense: 30 tablet; Refill: 2  3. Alcohol use disorder, severe, dependence (HCC)  Increased- naltrexone (DEPADE) 50 MG tablet; Take 1 tablet (50 mg total) by mouth daily.  Dispense: 30 tablet; Refill: 2   Follow up in 3 months Follow-up with therapy 11/22/2020, NP 09/22/2020, 10:51 AM

## 2020-09-23 ENCOUNTER — Other Ambulatory Visit: Payer: Self-pay | Admitting: Family Medicine

## 2020-09-23 ENCOUNTER — Telehealth: Payer: Self-pay

## 2020-09-23 NOTE — Telephone Encounter (Signed)
Please call Kandis Mannan [phone] before 3pm after 3pm [phone] about pt's meds [facility])

## 2020-09-23 NOTE — Progress Notes (Signed)
Session Time: 45 min  Participation Level: Active  Behavioral Response: CasualAlertMildly anxious  Type of Therapy: Individual Therapy  Treatment Goals addressed: Anxiety and Coping  Interventions: CBT and Supportive  Summary: Tyrone White is a 35 y.o. male who presents with hx of GAD and addiction problem. This date pt comes for in person session and is notably in a more positive mood. Pt reports he has been doing well. He is taking meds as prescribed and feels he has "more energy". Pt denies paranoia. He reports no successful intervention for disturbed sleep, still waking up several times per night even with new med. Pt does not have f/u med management appt until May. Post session LCSW able to facilitate pt being seen while here d/t cancellation in med provider's schedule. Pt advises he is using naltrexone and feels it is helping to reduce his cravings to use alcohol. Denies drug use. Pt states he has had no alcohol use since last session despite it being accessible to him from others in the house. Pt states "They all got busted yesterday and I wasn't in it". Commended pt for his choices. Pt repeats the Stop/think/listen strategy he is using regularly. Pt is still looking to graduate from program in May but stay on in the house. Pt reports he is still using cigarettes but at a reduced pace. Pt self bashing r/t nicotine use. LCSW assisted to reframe. Reviewed self talk in some detail with self esteem implications. Pt shares that one of his house mates died 2024/03/06in the hosp d/t organ failure after COVID. House mate, Mat Carne, in program with pt since he entered almost a year ago. LCSW assissted pt to process thoughts/feelings r/t loss. Provided grief education/support and literature for pt to take with him. Pt states he did not write a letter to his mother but states he talks to her. Pt states he is feeling more hopeful. He reports he has obtained a radio the staff are not taking from him so he has  been able to distract himself with music. Reports he is only listening to positive music, "no rap", enjoys country and finds this very helpful. Continues to use prayer for coping. LCSW reviewed poc including scheduling prior to close of session. Pt states appreciation for care.    Suicidal/Homicidal: Nowithout intent/plan  Therapist Response: Pt remains receptive to care.  Plan: Return again in 4 weeks.  Diagnosis: Axis I: Generalized Anxiety Disorder    Axis II: Deferred  Uriah Sink, LCSW 09/23/2020

## 2020-10-12 ENCOUNTER — Other Ambulatory Visit: Payer: Self-pay

## 2020-10-12 ENCOUNTER — Ambulatory Visit (INDEPENDENT_AMBULATORY_CARE_PROVIDER_SITE_OTHER): Payer: Self-pay | Admitting: Family Medicine

## 2020-10-12 ENCOUNTER — Encounter: Payer: Self-pay | Admitting: Family Medicine

## 2020-10-12 VITALS — BP 124/73 | HR 76 | Ht 73.0 in | Wt 203.0 lb

## 2020-10-12 DIAGNOSIS — M25531 Pain in right wrist: Secondary | ICD-10-CM

## 2020-10-12 DIAGNOSIS — Z09 Encounter for follow-up examination after completed treatment for conditions other than malignant neoplasm: Secondary | ICD-10-CM

## 2020-10-12 DIAGNOSIS — G8929 Other chronic pain: Secondary | ICD-10-CM

## 2020-10-12 DIAGNOSIS — R1032 Left lower quadrant pain: Secondary | ICD-10-CM

## 2020-10-12 DIAGNOSIS — K219 Gastro-esophageal reflux disease without esophagitis: Secondary | ICD-10-CM

## 2020-10-12 DIAGNOSIS — J302 Other seasonal allergic rhinitis: Secondary | ICD-10-CM

## 2020-10-12 DIAGNOSIS — R1084 Generalized abdominal pain: Secondary | ICD-10-CM

## 2020-10-12 MED ORDER — OMEPRAZOLE 20 MG PO CPDR: 20.0000 mg | DELAYED_RELEASE_CAPSULE | Freq: Every day | ORAL | 11 refills | Status: DC

## 2020-10-12 MED ORDER — LORATADINE 10 MG PO TABS: 10.0000 mg | ORAL_TABLET | Freq: Every day | ORAL | 11 refills | Status: DC

## 2020-10-12 NOTE — Progress Notes (Signed)
[facility]   Established Patient Office Visit  Subjective:  Patient ID: Tyrone White, male    DOB: 1986-04-10  Age: 35 y.o. MRN: [id number]  CC:  Chief Complaint  Patient presents with  . Hand Pain    Right side  . Gastroesophageal Reflux  . Groin Pain    HPI Tyrone White is a 35 year old male who presents for Follow Up today.   Current Status: Since his last office visit, he has c/o daily left groin pain, with increasing pain. He also continues to have right wrist pain, which he has been taking Acetaminophen without any relief. He currently works Environmental manager. His anxiety is increased today r/t his health status. He denies suicidal ideations, homicidal ideations, or auditory hallucinations. He denies fevers, chills, fatigue, recent infections, weight loss, and night sweats. He has not had any headaches, visual changes, dizziness, and falls. No chest pain, heart palpitations, cough and shortness of breath reported. Denies GI problems such as nausea, vomiting, diarrhea, and constipation. He has no reports of blood in stools, dysuria and hematuria. He is taking all medications as prescribed.  Past Medical History:  Diagnosis Date  . Anxiety   . 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 Depression     No past surgical history on file.  Family History  Problem Relation Age of Onset  . Heart disease Mother   . Hypertension Mother   . Diabetes Maternal Uncle   . Prostate cancer Father     Social History   Socioeconomic History  . Marital status: Single    Spouse name: Not on file  . Number of children: Not on file  . Years of education: Not on file  . Highest education level: Not on file  Occupational History  . Not on file  Tobacco Use  . Smoking status: Current Some Day Smoker    Types: Cigarettes  . Smokeless tobacco: Never Used  . Tobacco comment: "state that he  smokes 3 cigarettes a day"  Vaping Use  . Vaping Use: Never used  Substance and Sexual Activity  . Alcohol use: Not Currently  . Drug use: Not Currently  . Sexual activity: Not Currently  Other Topics Concern  . Not on file  Social History Narrative  . Not on file   Social Determinants of Health   Financial Resource Strain: Not on file  Food Insecurity: Not on file  Transportation Needs: Not on file  Physical Activity: Not on file  Stress: Not on file  Social Connections: Not on file  Intimate Partner Violence: Not on file    Outpatient Medications Prior to Visit  Medication Sig Dispense Refill  . busPIRone (BUSPAR) 15 MG tablet Take 1 tablet (15 mg total) by mouth 3 (three) times daily. 90 tablet 2  . famotidine (PEPCID) 20 MG tablet Take 1 tablet (20 mg total) by mouth 2 (two) times daily. 60 tablet 0  . mirtazapine (REMERON) 30 MG tablet Take 1 tablet (30 mg total) by mouth at bedtime. 30 tablet 2  . naltrexone (DEPADE) 50 MG tablet Take 1 tablet (50 mg total) by mouth daily. 30 tablet 2  . polyvinyl alcohol (LIQUIFILM TEARS) 1.4 % ophthalmic solution Place 1 drop into both eyes as needed for dry eyes.    Marland Kitchen QUEtiapine (SEROQUEL) 300 MG tablet Take 1 tablet (300 mg total) by mouth at bedtime. 30 tablet 2  No facility-administered medications prior to visit.    No Known Allergies  ROS Review of Systems  Constitutional: Negative.   HENT: Negative.   Eyes: Negative.   Respiratory: Negative.   Cardiovascular: Negative.   Gastrointestinal: Negative.   Endocrine: Negative.   Genitourinary: Positive for scrotal swelling.       Left groin pain  Musculoskeletal: Negative.   Skin: Negative.   Allergic/Immunologic: Negative.   Neurological: Negative.   Hematological: Negative.   Psychiatric/Behavioral: Positive for agitation. The patient is nervous/anxious and is hyperactive.    Objective:    Physical Exam Vitals and nursing note reviewed.  Constitutional:       Appearance: Normal appearance.  HENT:     Head: Normocephalic and atraumatic.     Nose: Nose normal.     Mouth/Throat:     Mouth: Mucous membranes are moist.     Pharynx: Oropharynx is clear.  Cardiovascular:     Rate and Rhythm: Normal rate and regular rhythm.     Pulses: Normal pulses.     Heart sounds: Normal heart sounds.  Pulmonary:     Effort: Pulmonary effort is normal.     Breath sounds: Normal breath sounds.  Abdominal:     General: Bowel sounds are normal.     Palpations: Abdomen is soft.  Genitourinary:    Comments: Left groin pain Musculoskeletal:        General: Swelling (right wrist ) present. Normal range of motion.     Cervical back: Normal range of motion and neck supple.     Comments: Right wrist pain  Skin:    General: Skin is warm and dry.  Neurological:     General: No focal deficit present.     Mental Status: He is alert and oriented to person, place, and time.  Psychiatric:        Mood and Affect: Mood normal.        Behavior: Behavior normal.        Thought Content: Thought content normal.        Judgment: Judgment normal.     BP 124/73   Pulse 76   Ht 6\' 1"  (1.854 m)   Wt 203 lb (92.1 kg)   SpO2 100%   BMI 26.78 kg/m  Wt Readings from Last 3 Encounters:  10/12/20 203 lb (92.1 kg)  08/30/20 200 lb 12.8 oz (91.1 kg)  08/08/20 210 lb (95.3 kg)     Health Maintenance Due  Topic Date Due  . Hepatitis C Screening  Never done  . COVID-19 Vaccine (1) Never done  . HIV Screening  Never done    There are no preventive care reminders to display for this patient.  Lab Results  Component Value Date   TSH 0.817 08/10/2020   Lab Results  Component Value Date   WBC 7.0 08/06/2020   HGB 13.9 08/06/2020   HCT 43.6 08/06/2020   MCV 90.1 08/06/2020   PLT 153 08/06/2020   Lab Results  Component Value Date   NA 139 08/10/2020   K 4.1 08/10/2020   CO2 26 08/10/2020   GLUCOSE 114 (H) 08/10/2020   BUN 17 08/10/2020   CREATININE 0.93  08/10/2020   BILITOT 0.3 08/10/2020   ALKPHOS 72 08/10/2020   AST 32 08/10/2020   ALT 35 08/10/2020   PROT 7.9 08/10/2020   ALBUMIN 4.8 08/10/2020   CALCIUM 10.1 08/10/2020   ANIONGAP 11 08/10/2020   Lab Results  Component Value Date   CHOL  164 06/23/2020   Lab Results  Component Value Date   HDL 50 06/23/2020   Lab Results  Component Value Date   LDLCALC 98 06/23/2020   Lab Results  Component Value Date   TRIG 84 06/23/2020   Lab Results  Component Value Date   CHOLHDL 3.3 06/23/2020   Lab Results  Component Value Date   HGBA1C 5.7 (A) 03/02/2020   HGBA1C 5.7 03/02/2020   HGBA1C 5.7 03/02/2020   HGBA1C 5.7 03/02/2020    Assessment & Plan:   1. Chronic pain of right wrist - CT WRIST RIGHT WO CONTRAST; Future  2. Left groin pain Order placed for Ultrasound.   3. Gastroesophageal reflux disease without esophagitis - omeprazole (PRILOSEC) 20 MG capsule; Take 1 capsule (20 mg total) by mouth daily.  Dispense: 30 capsule; Refill: 11  4. Chronic generalized abdominal pain  5. Seasonal allergies - loratadine (CLARITIN) 10 MG tablet; Take 1 tablet (10 mg total) by mouth daily.  Dispense: 30 tablet; Refill: 11  6. Follow up He will follow up 06/2021.   Meds ordered this encounter  Medications  . omeprazole (PRILOSEC) 20 MG capsule    Sig: Take 1 capsule (20 mg total) by mouth daily.    Dispense:  30 capsule    Refill:  11  . loratadine (CLARITIN) 10 MG tablet    Sig: Take 1 tablet (10 mg total) by mouth daily.    Dispense:  30 tablet    Refill:  11    Orders Placed This Encounter  Procedures  . CT WRIST RIGHT WO CONTRAST  . VAS [place] GROIN PSEUDOANEURYSM    Referral Orders  No referral(s) requested today    Raliegh Ip, MSN, ANE, FNP-BC [facility] [address] [place], [place] [address] [phone] fax   Problem List Items Addressed This  Visit      Digestive   Gastroesophageal reflux disease   Relevant Medications   omeprazole (PRILOSEC) 20 MG capsule    Other Visit Diagnoses    Chronic pain of right wrist    -  Primary   Relevant Orders   CT WRIST RIGHT WO CONTRAST   Left groin pain       Relevant Orders   VAS [place] GROIN PSEUDOANEURYSM   Chronic generalized abdominal pain       Seasonal allergies       Relevant Medications   loratadine (CLARITIN) 10 MG tablet   Follow up          Meds ordered this encounter  Medications  . omeprazole (PRILOSEC) 20 MG capsule    Sig: Take 1 capsule (20 mg total) by mouth daily.    Dispense:  30 capsule    Refill:  11  . loratadine (CLARITIN) 10 MG tablet    Sig: Take 1 tablet (10 mg total) by mouth daily.    Dispense:  30 tablet    Refill:  11    Follow-up: No follow-ups on file.    Kallie Locks, FNP

## 2020-10-12 NOTE — Patient Instructions (Signed)
Wrist and Forearm Exercises Ask your health care provider which exercises are safe for you. Do exercises exactly as told by your health care provider and adjust them as directed. It is normal to feel mild stretching, pulling, tightness, or discomfort as you do these exercises. Stop right away if you feel sudden pain or your pain gets worse. Do not begin these exercises until told by your health care provider. Range-of-motion exercises These exercises warm up your muscles and joints and improve the movement and flexibility of your injured wrist and forearm. These exercises also help to relieve pain, numbness, and tingling. These exercises are done using the muscles in your injured wrist and forearm. Wrist flexion 1. Bend your left / right elbow to a 90-degree angle (right angle) with your palm facing the floor. 2. Bend your wrist so that your fingers point toward the floor (flexion). 3. Hold this position for __________ seconds. 4. Slowly return to the starting position. Repeat __________ times. Complete this exercise __________ times a day. Wrist extension 1. Bend your left / right elbow to a 90-degree angle (right angle) with your palm facing the floor. 2. Bend your wrist so that your fingers point toward the ceiling (extension). 3. Hold this position for __________ seconds. 4. Slowly return to the starting position. Repeat __________ times. Complete this exercise __________ times a day. Ulnar deviation 1. Bend your left / right elbow to a 90-degree angle (right angle), and rest your forearm on a table with your palm facing down. 2. Keeping your hand flat on the table, bend your left /right wrist toward your small finger (pinkie). This is ulnar deviation. 3. Hold this position for __________ seconds. 4. Slowly return to the starting position. Repeat __________ times. Complete this exercise __________ times a day. Radial deviation 1. Bend your left / right elbow to a 90-degree angle (right  angle), and rest your forearm on a table with your palm facing down. 2. Keeping your hand flat on the table, bend your left /right wrist toward your thumb. This is radial deviation. 3. Hold this position for __________ seconds. 4. Slowly return to the starting position. Repeat __________ times. Complete this exercise __________ times a day. Forearm rotation, supination 1. Sit with your left / right elbow bent to a 90-degree angle (right angle). Position your forearm so that the thumb is facing the ceiling (neutral position). 2. Turn (rotate) your palm up toward the ceiling (supination), stopping when you feel a gentle stretch. 3. Hold this position for __________ seconds. 4. Slowly return to the starting position. Repeat __________ times. Complete this exercise __________ times a day.   Forearm rotation, pronation 1. Sit with your left / right elbow bent to a 90-degree angle (right angle). Position your forearm so that the thumb is facing the ceiling (neutral position). 2. Rotate your palm down toward the floor (pronation), stopping when you feel a gentle stretch. 3. Hold this position for __________ seconds. 4. Slowly return to the starting position. Repeat __________ times. Complete this exercise __________ times a day.   Stretching These exercises warm up your muscles and joints and improve the movement and flexibility of your injured wrist and forearm. These exercises also help to relieve pain, numbness, and tingling. These exercises are done using your healthy wrist and forearm to help stretch the muscles in your injured wrist and forearm. Wrist flexion 1. Extend your left / right arm in front of you, and turn your palm down toward the floor. ? If told  by your health care provider, bend your left / right elbow to a 90-degree angle (right angle) at your side. 2. Using your uninjured hand, gently press over the back of your left / right hand to bend your wrist and fingers toward the floor  (flexion). Go as far as you can to feel a stretch without causing pain. 3. Hold this position for __________ seconds. 4. Slowly return to the starting position. Repeat __________ times. Complete this exercise __________ times a day.   Wrist extension 1. Extend your left / right arm in front of you and turn your palm up toward the ceiling. ? If told by your health care provider, bend your left / right elbow to a 90-degree angle (right angle) at your side. 2. Using your uninjured hand, gently press over the palm of your left / right hand to bend your wrist and fingers toward the floor (extension). Go as far as you can to feel a stretch without causing pain. 3. Hold this position for __________ seconds. 4. Slowly return to the starting position. Repeat __________ times. Complete this exercise __________ times a day.   Forearm rotation, supination 1. Sit with your left / right elbow bent to a 90-degree angle (right angle). Position your forearm so that the thumb is facing the ceiling (neutral position). 2. Rotate your palm up toward the ceiling as far as you can on your own (supination). Then, use your uninjured hand to help turn your forearm more, stopping when you feel a gentle stretch. 3. Hold this position for __________ seconds. 4. Slowly return to the starting position. Repeat __________ times. Complete this exercise __________ times a day. Forearm rotation, pronation 1. Sit with your left / right elbow bent to a 90-degree angle (right angle). Position your forearm so that the thumb is facing the ceiling (neutral position). 2. Rotate your palm down toward the floor as far as you can on your own (pronation). Then, use your uninjured hand to help turn your forearm more, stopping when you feel a gentle stretch. 3. Hold this position for __________ seconds. 4. Slowly return to the starting position. Repeat __________ times. Complete this exercise __________ times a day. Strengthening  exercises These exercises build strength and endurance in your wrist and forearm. Endurance is the ability to use your muscles for a long time, even after they get tired. Wrist flexion 1. Sit with your left / right forearm supported on a table or other surface. Bend your elbow to a 90-degree angle (right angle), and rest your hand palm-up over the edge of the table. 2. Hold a __________ weight in your left / right hand. Or, hold an exercise band or tube in both hands, keeping your hands at the same level and hip distance apart. There should be a slight tension in the exercise band or tube. 3. Slowly curl your hand up toward the ceiling (flexion). 4. Hold this position for __________ seconds. 5. Slowly lower your hand back to the starting position. Repeat __________ times. Complete this exercise __________ times a day.   Wrist extension 1. Sit with your left / right forearm supported on a table or other surface. Bend your elbow to a 90-degree angle (right angle), and rest your hand palm-down over the edge of the table. 2. Hold a __________ weight in your left / right hand. Or, hold an exercise band or tube in both hands, keeping your hands at the same level and hip distance apart. There should be a slight  tension in the exercise band or tube. 3. Slowly curl your hand up toward the ceiling (extension). 4. Hold this position for __________ seconds. 5. Slowly lower your hand back to the starting position. Repeat __________ times. Complete this exercise __________ times a day.   Forearm rotation, supination 1. Sit with your left / right forearm supported on a table or other surface. Bend your elbow to a 90-degree angle (right angle). Position your forearm so that your thumb is facing the ceiling (neutral position) and your hand is resting over the edge of the table. 2. Hold a hammer in your left / right hand. ? This exercise will be easier if you hold the hammer near the head of the hammer. ? This  exercise will be harder if you hold the hammer near the end of the handle. 3. Without moving your elbow, slowly rotate your palm up toward the ceiling (supination). 4. Hold this position for __________ seconds. 5. Slowly return to the starting position. Repeat __________ times. Complete this exercise __________ times a day.   Forearm rotation, pronation 1. Sit with your left / right forearm supported on a table or other surface. Bend your elbow to a 90-degree angle (right angle). Position your forearm so that the thumb is facing the ceiling (neutral position), with your hand resting over the edge of the table. 2. Hold a hammer in your left / right hand. ? This exercise will be easier if you hold the hammer near the head of the hammer. ? This exercise will be harder if you hold the hammer near the end of the handle. 3. Without moving your elbow, slowly rotate your palm down toward the floor (pronation). 4. Hold this position for __________ seconds. 5. Slowly return to the starting position. Repeat __________ times. Complete this exercise __________ times a day.   Grip strengthening 1. Grasp a stress ball or other ball in the middle of your left / right hand. Start with your elbow bent to a 90-degree angle (right angle). 2. Slowly increase the pressure, squeezing the ball as hard as you can without causing pain. ? Think of bringing the tips of your fingers into the middle of your palm. All of your finger joints should bend when doing this exercise. ? To make this exercise harder, gradually try to straighten your elbow in front of you, until you can do the exercise with your elbow fully straight. 3. Hold your squeeze for __________ seconds, then relax. If instructed by your health care provider, do this exercise: ? With your forearm positioned so that the thumb is facing the ceiling (neutral position). ? With your forearm turned palm down. ? With your forearm turned palm up. Repeat __________  times. Complete this exercise __________ times a day.   This information is not intended to replace advice given to you by your health care provider. Make sure you discuss any questions you have with your health care provider. Document Revised: 08/29/2018 Document Reviewed: 08/29/2018 Elsevier Patient Education  2021 [employer]   Adductor Muscle Strain  An adductor muscle strain, also called a groin strain or pull, is an injury to the muscles or tendons on the upper, inner part of the thigh. These muscles are called the adductor muscles or groin muscles. They are responsible for moving the legs across the body or pulling the legs together. A muscle strain occurs when a muscle is overstretched and some muscle fibers are torn. An adductor muscle strain can range from mild to  severe, depending on how many muscle fibers are affected and whether the muscle fibers are partially or completely torn. What are the causes? Adductor muscle strains usually occur during exercise or while participating in sports. The injury often happens when a sudden, violent force is placed on a muscle, stretching the muscle too far. A strain is more likely to happen when your muscles are not warmed up or if you are not properly conditioned. This injury may be caused by:  Stretching the adductor muscles too far or too suddenly, often during side-to-side motion with a sudden change in direction.  Putting repeated stress on the adductor muscles over a long period of time.  Performing vigorous activity without properly stretching the adductor muscles beforehand. What are the signs or symptoms? Symptoms of this condition include:  Pain and tenderness in the groin area. This begins as sharp pain and persists as a dull ache.  A popping or snapping feeling when the injury occurs (for severe strains).  Swelling or bruising.  Muscle spasms.  Weakness in the leg.  Stiffness in the groin area with decreased ability to  move the affected muscles. How is this diagnosed? This condition may be diagnosed based on:  Your symptoms and a description of how the injury occurred.  A physical exam.  Imaging tests, such as: ? X-rays. These are sometimes needed to rule out a broken bone or cartilage problems. ? An ultrasound, CT scan, or MRI. These may be done if your health care provider suspects a complete muscle tear or needs to check for other injuries. How is this treated? An adductor strain will often heal on its own. If needed, this condition may be treated with:  PRICE therapy. PRICE stands for protection of the injured area, rest, ice, pressure (compression), and elevation.  Medicines to help manage pain and swelling (anti-inflammatory medicines).  Crutches. You may be directed to use these for the first few days to minimize your pain. Depending on the severity of the muscle strain, recovery time may vary from a few weeks to several months. Severe injuries often require 4-6 weeks for recovery. In those cases, complete healing can take 4-5 months. Follow these instructions at home: PRICE Therapy  Protect the muscle from being injured again.  Rest. Do not use the strained muscle if it causes pain.  If directed, put ice on the injured area: ? Put ice in a plastic bag. ? Place a towel between your skin and the bag. ? Leave the ice on for 20 minutes, 2-3 times a day. Do this for the first 2 days after the injury.  Apply compression by wrapping the injured area with an elastic bandage as told by your health care provider.  Raise (elevate) the injured area above the level of your heart while you are sitting or lying down.   General instructions  Take over-the-counter and prescription medicines only as told by your health care provider.  Walk, stretch, and do exercises as told by your health care provider. Only do these activities if you can do so without any pain.  Follow your treatment plan as told by  your health care provider. This may include: ? Physical therapy. ? Massage. ? Local electrical stimulation (transcutaneous electrical nerve stimulation, TENS). How is this prevented?  Warm up and stretch before being active.  Cool down and stretch after being active.  Give your body time to rest between periods of activity.  Make sure to use equipment that fits you.  Be  safe and responsible while being active to avoid slips and falls.  Maintain physical fitness, including: ? Proper conditioning in the adductor muscles. ? Overall strength, flexibility, and endurance. Contact a health care provider if:  You have increased pain or swelling in the affected area.  Your symptoms are not improving or they are getting worse. Summary  An adductor muscle strain, also called a groin strain or pull, is an injury to the muscles or tendons on the upper, inner part of the thigh.  A muscle strain occurs when a muscle is overstretched and some muscle fibers are torn.  Depending on the severity of the muscle strain, recovery time may vary from a few weeks to several months. This information is not intended to replace advice given to you by your health care provider. Make sure you discuss any questions you have with your health care provider. Document Revised: 10/29/2018 Document Reviewed: 12/10/2017 Elsevier Patient Education  2021 [employer].

## 2020-10-28 ENCOUNTER — Other Ambulatory Visit: Payer: Self-pay

## 2020-10-28 ENCOUNTER — Ambulatory Visit (INDEPENDENT_AMBULATORY_CARE_PROVIDER_SITE_OTHER): Payer: No Payment, Other | Admitting: Licensed Clinical Social Worker

## 2020-10-28 DIAGNOSIS — F411 Generalized anxiety disorder: Secondary | ICD-10-CM | POA: Diagnosis not present
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
## 2020-11-09 ENCOUNTER — Other Ambulatory Visit: Payer: Self-pay | Admitting: Family Medicine

## 2020-11-09 ENCOUNTER — Other Ambulatory Visit (HOSPITAL_COMMUNITY): Payer: Self-pay | Admitting: Psychiatry

## 2020-11-09 DIAGNOSIS — F330 Major depressive disorder, recurrent, mild: Secondary | ICD-10-CM

## 2020-11-09 DIAGNOSIS — F411 Generalized anxiety disorder: Secondary | ICD-10-CM

## 2020-11-13 ENCOUNTER — Other Ambulatory Visit: Payer: Self-pay

## 2020-11-13 ENCOUNTER — Encounter (HOSPITAL_COMMUNITY): Payer: Self-pay

## 2020-11-13 ENCOUNTER — Ambulatory Visit (HOSPITAL_COMMUNITY)
Admission: EM | Admit: 2020-11-13 | Discharge: 2020-11-13 | Disposition: A | Discharge status: Home / Self Care | Payer: Medicaid Other | Attending: Physician Assistant | Admitting: Physician Assistant

## 2020-11-13 DIAGNOSIS — K0889 Other specified disorders of teeth and supporting structures: Secondary | ICD-10-CM

## 2020-11-13 DIAGNOSIS — R1032 Left lower quadrant pain: Secondary | ICD-10-CM

## 2020-11-13 DIAGNOSIS — K4090 Unilateral inguinal hernia, without obstruction or gangrene, not specified as recurrent: Secondary | ICD-10-CM

## 2020-11-13 MED ORDER — AMOXICILLIN-POT CLAVULANATE 875-125 MG PO TABS: 1.0000 | ORAL_TABLET | Freq: Two times a day (BID) | ORAL | 0 refills | Status: DC

## 2020-11-13 NOTE — Discharge Instructions (Signed)
Take Augmentin to cover for dental infection.  Follow-up with dentist.  He can take Tylenol and ibuprofen for pain relief.  If you have any swelling of your mouth/face, trouble swallowing, fever, nausea, vomiting you need to be reevaluated.  I believe that you have hernia.  Unfortunately, there is medication that manages this.  It is important you avoid strenuous lifting.  If you have any bulge that does not go down or significant pain need to be evaluated as this can be a surgical emergency.  Please call surgeon for further evaluation.

## 2020-11-13 NOTE — ED Provider Notes (Signed)
[facility]    CSN: [id number] Arrival date & time: 11/13/20  1301      History   Chief Complaint Chief Complaint  Patient presents with  . Groin Pain  . Dental Pain    HPI Tyrone White is a 35 y.o. male.   Patient presents today with a several day history of right lower posterior molar pain.  He reports associated swelling of right mandible.  He has had work on this tooth but denies any recent injury or recent dental procedures.  Pain is rated 8 on a 0-10 pain scale, described as throbbing, worse with mastication, no alleviating factors identified.  He denies any fever, difficulty swallowing, muffled voice.  He has tried ibuprofen without improvement of symptoms.  Denies any recent antibiotic use.  He is able to eat and drink normally despite symptoms and has no concern for dehydration.  In addition, patient reports a several month history of intermittent left groin pain.  He denies known injury or increase in activity prior to symptom onset.  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  He reports pain is always localized to the left side.  He denies any scrotal pain, testicular swelling, penile discharge, penile swelling, dysuria, urinary symptoms, fever.  He has tried Tylenol and ibuprofen without improvement of symptoms.  He has not been evaluated for this in the past.  He denies any bulge, swelling, fever, changes in bowel habits.     Past Medical History:  Diagnosis Date  . Anxiety   . 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 Depression     Patient Active Problem List   Diagnosis Date Noted  . Gastroesophageal reflux disease 10/12/2020  . Major depressive disorder, recurrent severe without psychotic features (HCC) 08/08/2020  . Suicidal ideations 08/07/2020  . Severe episode of recurrent major depressive disorder, without psychotic features (HCC)   . Alcohol  use disorder, severe, dependence (HCC)   . Generalized anxiety disorder 01/29/2020  . 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 Left lower quadrant abdominal pain 01/29/2020    History reviewed. No pertinent surgical history.     Home Medications    Prior to Admission medications   Medication Sig Start Date End Date Taking? Authorizing Provider  amoxicillin-clavulanate (AUGMENTIN) 875-125 MG tablet Take 1 tablet by mouth every 12 (twelve) hours. 11/13/20  Yes Hani Patnode K, PA-C  busPIRone (BUSPAR) 15 MG tablet TAKE 1 TABLET (15 MG TOTAL) BY MOUTH 3 (THREE) TIMES DAILY. 11/09/20   Shanna Cisco, NP  famotidine (PEPCID) 20 MG tablet Take 1 tablet (20 mg total) by mouth 2 (two) times daily. 08/11/20   Laveda Abbe, NP  loratadine (CLARITIN) 10 MG tablet Take 1 tablet (10 mg total) by mouth daily. 10/12/20   Kallie Locks, FNP  mirtazapine (REMERON) 30 MG tablet Take 1 tablet (30 mg total) by mouth at bedtime. 09/22/20   Shanna Cisco, NP  naltrexone (DEPADE) 50 MG tablet Take 1 tablet (50 mg total) by mouth daily. 09/22/20   Shanna Cisco, NP  omeprazole (PRILOSEC) 20 MG capsule Take 1 capsule (20 mg total) by mouth daily. 10/12/20   Kallie Locks, FNP  polyvinyl alcohol (LIQUIFILM TEARS) 1.4 % ophthalmic solution Place 1 drop into both eyes as needed for dry eyes.    [provider]  QUEtiapine (SEROQUEL) 300  MG tablet TAKE 1 TABLET (300 MG TOTAL) BY MOUTH AT BEDTIME. 11/09/20   Shanna Cisco, NP    Family History Family History  Problem Relation Age of Onset  . Heart disease Mother   . Hypertension Mother   . Diabetes Maternal Uncle   . Prostate cancer Father     Social History Social History   Tobacco Use  . Smoking status: Current Some Day Smoker    Types: Cigarettes  . Smokeless tobacco: Never Used  . Tobacco comment: "state that he smokes 3 cigarettes a day"  Vaping Use  . Vaping Use: Never used   Substance Use Topics  . Alcohol use: Not Currently  . Drug use: Not Currently     Allergies   Patient has no known allergies.   Review of Systems Review of Systems  Constitutional: Negative for activity change, appetite change, fatigue and fever.  HENT: Positive for dental problem. Negative for congestion, sinus pressure, sneezing, sore throat and trouble swallowing.   Respiratory: Negative for cough and shortness of breath.   Cardiovascular: Negative for chest pain.  Gastrointestinal: Negative for abdominal pain, diarrhea, nausea and vomiting.  Genitourinary: Negative for difficulty urinating, dysuria, penile discharge, penile swelling, scrotal swelling and testicular pain.  Musculoskeletal: Negative for arthralgias and myalgias.  Neurological: Negative for dizziness, light-headedness and headaches.     Physical Exam Triage Vital Signs ED Triage Vitals  Enc Vitals Group     BP 11/13/20 1355 128/74     Pulse Rate 11/13/20 1355 75     Resp 11/13/20 1355 18     Temp 11/13/20 1355 97.8 F (36.6 C)     Temp Source 11/13/20 1355 Oral     SpO2 --      Weight --      Height --      Head Circumference --      Peak Flow --      Pain Score 11/13/20 1352 8     Pain Loc --      Pain Edu? --      Excl. in GC? --    No data found.  Updated Vital Signs BP 128/74 (BP Location: Right Arm)   Pulse 75   Temp 97.8 F (36.6 C) (Oral)   Resp 18   Visual Acuity Right Eye Distance:   Left Eye Distance:   Bilateral Distance:    Right Eye Near:   Left Eye Near:    Bilateral Near:     Physical Exam Vitals reviewed. Exam conducted with a chaperone present.  Constitutional:      General: He is awake.     Appearance: Normal appearance. He is normal weight. He is not ill-appearing.     Comments: Very pleasant male appears stated age in no acute distress  HENT:     Head: Normocephalic and atraumatic.     Right Ear: Tympanic membrane, ear canal and external ear normal. Tympanic  membrane is not erythematous or bulging.     Left Ear: Tympanic membrane, ear canal and external ear normal. Tympanic membrane is not erythematous or bulging.     Nose: Nose normal.     Mouth/Throat:     Dentition: Abnormal dentition. Dental tenderness and gingival swelling present.     Pharynx: Uvula midline. No oropharyngeal exudate, posterior oropharyngeal erythema or uvula swelling.   Cardiovascular:     Rate and Rhythm: Normal rate and regular rhythm.     Heart sounds: No murmur heard.   Pulmonary:  Effort: Pulmonary effort is normal. No accessory muscle usage or respiratory distress.     Breath sounds: Normal breath sounds. No stridor. No wheezing, rhonchi or rales.     Comments: Clear to auscultation bilaterally Abdominal:     General: Bowel sounds are normal.     Palpations: Abdomen is soft.     Tenderness: There is no abdominal tenderness.     Hernia: A hernia is present. Hernia is present in the left inguinal area. There is no hernia in the right inguinal area.  Genitourinary:    Testes: Normal.     Comments: Adriana, RN present as chaperone during exam Lymphadenopathy:     Head:     Right side of head: No submental, submandibular or tonsillar adenopathy.     Left side of head: No submental, submandibular or tonsillar adenopathy.     Cervical: No cervical adenopathy.  Neurological:     Mental Status: He is alert.  Psychiatric:        Behavior: Behavior is cooperative.      UC Treatments / Results  Labs (all labs ordered are listed, but only abnormal results are displayed) Labs Reviewed - No data to display  EKG   Radiology No results found.  Procedures Procedures (including critical care time)  Medications Ordered in UC Medications - No data to display  Initial Impression / Assessment and Plan / UC Course  I have reviewed the triage vital signs and the nursing notes.  Pertinent labs & imaging results that were available during my care of the  patient were reviewed by me and considered in my medical decision making (see chart for details).     Dental infection noted on exam.  Patient started on Augmentin and encouraged to follow-up with dentist.  He can alternate Tylenol and ibuprofen for pain relief.  Strict return precaution given to which patient expressed understanding.  Inguinal hernia noted on exam.  Discussed pathophysiology of this with patient including alarm symptoms that warrant emergent evaluation including bulge that is not reducible, erythema, increased pain.  Discussed that there is not a medication to help treat this but he should avoid heavy lifting.  He was provided a work excuse note.  He was provided contact information for surgeon to discuss additional treatment options.  Strict return precautions given to which patient expressed understanding.  Final Clinical Impressions(s) / UC Diagnoses   Final diagnoses:  Pain, dental  Dentalgia  Left groin pain  Non-recurrent unilateral inguinal hernia without obstruction or gangrene     Discharge Instructions     Take Augmentin to cover for dental infection.  Follow-up with dentist.  He can take Tylenol and ibuprofen for pain relief.  If you have any swelling of your mouth/face, trouble swallowing, fever, nausea, vomiting you need to be reevaluated.  I believe that you have hernia.  Unfortunately, there is medication that manages this.  It is important you avoid strenuous lifting.  If you have any bulge that does not go down or significant pain need to be evaluated as this can be a surgical emergency.  Please call surgeon for further evaluation.    ED Prescriptions    Medication Sig Dispense Auth. Provider   amoxicillin-clavulanate (AUGMENTIN) 875-125 MG tablet Take 1 tablet by mouth every 12 (twelve) hours. 14 tablet Reveca Desmarais, Noberto Retort, PA-C     PDMP not reviewed this encounter.   Jeani Hawking, PA-C 11/13/20 1426

## 2020-11-13 NOTE — ED Triage Notes (Signed)
Pt presents with dental pain x 1 day; groin pain x 1 week, pain is worse when sitting.

## 2020-11-25 ENCOUNTER — Ambulatory Visit (INDEPENDENT_AMBULATORY_CARE_PROVIDER_SITE_OTHER): Payer: No Payment, Other | Admitting: Licensed Clinical Social Worker

## 2020-11-25 ENCOUNTER — Other Ambulatory Visit: Payer: Self-pay

## 2020-11-25 ENCOUNTER — Ambulatory Visit (INDEPENDENT_AMBULATORY_CARE_PROVIDER_SITE_OTHER): Payer: No Payment, Other | Admitting: Psychiatry

## 2020-11-25 ENCOUNTER — Encounter (HOSPITAL_COMMUNITY): Payer: Self-pay | Admitting: Psychiatry

## 2020-11-25 VITALS — BP 124/79 | HR 93 | Ht 73.0 in | Wt 203.0 lb

## 2020-11-25 DIAGNOSIS — F1020 Alcohol dependence, uncomplicated: Secondary | ICD-10-CM | POA: Diagnosis not present

## 2020-11-25 DIAGNOSIS — F411 Generalized anxiety disorder: Secondary | ICD-10-CM | POA: Diagnosis not present

## 2020-11-25 DIAGNOSIS — F330 Major depressive disorder, recurrent, mild: Secondary | ICD-10-CM | POA: Diagnosis not present

## 2020-11-25 DIAGNOSIS — M25531 Pain in right wrist: Secondary | ICD-10-CM | POA: Insufficient documentation

## 2020-11-25 MED ORDER — QUETIAPINE FUMARATE 300 MG PO TABS: 300.0000 mg | ORAL_TABLET | Freq: Every day | ORAL | 2 refills | Status: DC

## 2020-11-25 MED ORDER — OMEPRAZOLE 20 MG PO CPDR: 20.0000 mg | DELAYED_RELEASE_CAPSULE | Freq: Every day | ORAL | 11 refills | Status: DC

## 2020-11-25 MED ORDER — NALTREXONE HCL 50 MG PO TABS: 50.0000 mg | ORAL_TABLET | Freq: Every day | ORAL | 2 refills | Status: DC

## 2020-11-25 MED ORDER — BUSPIRONE HCL 10 MG PO TABS: 10.0000 mg | ORAL_TABLET | Freq: Three times a day (TID) | ORAL | 2 refills | Status: DC

## 2020-11-25 MED ORDER — MIRTAZAPINE 30 MG PO TABS: 30.0000 mg | ORAL_TABLET | Freq: Every day | ORAL | 2 refills | Status: DC

## 2020-11-25 NOTE — Progress Notes (Signed)
[facility] MD/PA/NP OP Progress Note  11/25/2020 4:34 PM Tyrone White  MRN:  [id number]  Chief Complaint: "I have only been taking half of my BuSpar and at times to have problems sleeping" Chief Complaint    Medication Management      HPI: 35 yo male seen today for follow up psychiatric evaluation. Patient has psychiatric hx of anxiety, depression, marijuana use disorder, and methamphetamine use disorder. He is currently managed on Mirtazepine 30 mg, Seroquel 300 mg nightly, Naltrexone 50 mg daily, and Buspar 15 mg TID. He notes that his medications are somewhat effective in managing his psychiatric conditions, however notes he has been having poor sleep and notes that he have been taking half his Buspar's.   Today he is well groomed, pleasant, cooperative, and engaged in conversation. He informed provider that he continues to have poor sleep.  Provider asked him how many hours a night he is sleeping and he notes that he is unsure however reports it is disrupted and not restful.  He also notes that his anxiety has somewhat improved however reports that he is anxious most days due to changes in his life.  He informed Clinical research associate that he will be graduating from [employer] in May.  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  He notes that he plans to stay at [facility] house however reports that he would like to find an apartment of his own.  He notes that he was offered sober living however notes that he prefers to be by himself.  Today provider conducted a GAD-7 and patient scored a 9, at his last visit he scored an 18.  Provider also conducted a PHQ-9 patient scored a 3, at his last visit he scored a 15.  Today he denies SI/HI/VAH, mania, or paranoia.   Patient notes that his hernia pain has somewhat subsided and reports that he has a follow-up appointment with his doctor who will decide if he needs surgery or not.  He notes that he however continues to have pain in  his right hand and it was somewhat swollen.  He notes that movement exacerbates his pain however informed provider that he cannot avoid moving it because its its his dominant hand.  He request that provider referred him out for further evaluation.  Provider referred patient to [facility] health physical medication and rehab.  Provider also informed patient that he can receive services at [facility].  He endorsed understanding.  Patient request to reduce his BuSpar because he notes he feels that it makes him more anxious.  Provider informed patient that generally BuSpar helps manage his anxiety.  He endorsed understanding however elected to reduce it.  Today he is agreeable to reducing BuSpar 15 mg to 10 mg 3 times daily to help manage anxiety and depression.  At this time he notes that he does not want to increase mirtazapine to help manage sleep, anxiety and depression.  He will follow-up with outpatient counseling for therapy.  No other concerns noted at this time.    Visit Diagnosis:    ICD-10-CM   1. Arthralgia of right forearm  M25.531 Ambulatory referral to Physical Medicine Rehab  2. Generalized anxiety disorder  F41.1 busPIRone (BUSPAR) 10 MG tablet    mirtazapine (REMERON) 30 MG tablet    QUEtiapine (SEROQUEL) 300 MG tablet  3. Mild episode of recurrent major depressive disorder (HCC)  F33.0 mirtazapine (REMERON) 30 MG tablet    QUEtiapine (  SEROQUEL) 300 MG tablet  4. Alcohol use disorder, severe, dependence (HCC)  F10.20 naltrexone (DEPADE) 50 MG tablet    Past Psychiatric History: anxiety, depression, marijuana use disorder, and methamphetamine use disorder.  Past Medical History:  Past Medical History:  Diagnosis Date  . Anxiety   . 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 Depression    No past surgical history on file.  Family Psychiatric History: Maternal aunts and uncles substance use.   Family History:  Family History   Problem Relation Age of Onset  . Heart disease Mother   . Hypertension Mother   . Diabetes Maternal Uncle   . Prostate cancer Father     Social History:  Social History   Socioeconomic History  . Marital status: Single    Spouse name: Not on file  . Number of children: Not on file  . Years of education: Not on file  . Highest education level: Not on file  Occupational History  . Not on file  Tobacco Use  . Smoking status: Current Some Day Smoker    Types: Cigarettes  . Smokeless tobacco: Never Used  . Tobacco comment: "state that he smokes 3 cigarettes a day"  Vaping Use  . Vaping Use: Never used  Substance and Sexual Activity  . Alcohol use: Not Currently  . Drug use: Not Currently  . Sexual activity: Not Currently  Other Topics Concern  . Not on file  Social History Narrative  . Not on file   Social Determinants of Health   Financial Resource Strain: Not on file  Food Insecurity: Not on file  Transportation Needs: Not on file  Physical Activity: Not on file  Stress: Not on file  Social Connections: Not on file    Allergies: No Known Allergies  Metabolic Disorder Labs: Lab Results  Component Value Date   HGBA1C 5.7 (A) 03/02/2020   HGBA1C 5.7 03/02/2020   HGBA1C 5.7 03/02/2020   HGBA1C 5.7 03/02/2020   No results found for: PROLACTIN Lab Results  Component Value Date   CHOL 164 06/23/2020   TRIG 84 06/23/2020   HDL 50 06/23/2020   CHOLHDL 3.3 06/23/2020   LDLCALC 98 06/23/2020   Lab Results  Component Value Date   TSH 0.817 08/10/2020   TSH 1.380 06/23/2020    Therapeutic Level Labs: No results found for: LITHIUM No results found for: VALPROATE No components found for:  CBMZ  Current Medications: Current Outpatient Medications  Medication Sig Dispense Refill  . amoxicillin-clavulanate (AUGMENTIN) 875-125 MG tablet Take 1 tablet by mouth every 12 (twelve) hours. 14 tablet 0  . famotidine (PEPCID) 20 MG tablet Take 1 tablet (20 mg total)  by mouth 2 (two) times daily. 60 tablet 0  . loratadine (CLARITIN) 10 MG tablet Take 1 tablet (10 mg total) by mouth daily. 30 tablet 11  . polyvinyl alcohol (LIQUIFILM TEARS) 1.4 % ophthalmic solution Place 1 drop into both eyes as needed for dry eyes.    . busPIRone (BUSPAR) 10 MG tablet Take 1 tablet (10 mg total) by mouth 3 (three) times daily. 90 tablet 2  . mirtazapine (REMERON) 30 MG tablet Take 1 tablet (30 mg total) by mouth at bedtime. 30 tablet 2  . naltrexone (DEPADE) 50 MG tablet Take 1 tablet (50 mg total) by mouth daily. 30 tablet 2  . QUEtiapine (SEROQUEL) 300 MG tablet Take 1 tablet (300 mg total) by mouth at bedtime. 30 tablet 2  No current facility-administered medications for this visit.     Musculoskeletal: Strength & Muscle Tone: within normal limits Gait & Station: normal Patient leans: N/A  Psychiatric Specialty Exam: Review of Systems  Blood pressure 124/79, pulse 93, height 6\' 1"  (1.854 m), weight 203 lb (92.1 kg), SpO2 99 %.Body mass index is 26.78 kg/m.  General Appearance: Well Groomed  Eye Contact:  Good  Speech:  Clear and Coherent and Normal Rate  Volume:  Normal  Mood:  Euphoric and Notes that at times he feels anxious however reports that he is able to cope with it  Affect:  Appropriate and Congruent  Thought Process:  Coherent, Goal Directed and Linear  Orientation:  Full (Time, Place, and Person)  Thought Content: WDL and Logical   Suicidal Thoughts:  No  Homicidal Thoughts:  No  Memory:  Immediate;   Good Recent;   Good Remote;   Good  Judgement:  Good  Insight:  Good  Psychomotor Activity:  Normal  Concentration:  Concentration: Good and Attention Span: Good  Recall:  Good  Fund of Knowledge: Good  Language: Good  Akathisia:  No  Handed:  Right  AIMS (if indicated): Not done  Assets:  Communication Skills Desire for Improvement Financial Resources/Insurance Housing Social Support  ADL's:  Intact  Cognition: WNL  Sleep:  Poor    Screenings: AIMS   Flowsheet Row [facility]  AIMS Total Score 0    AUDIT   Flowsheet [facility]  Alcohol Use Disorder Identification Test Final Score (AUDIT) 16 32    GAD-7   Flowsheet Row Clinical [facility]2020 [facility]  Total GAD-7 Score 9 18 15 9     PHQ2-9   Flowsheet Row [facility] Clinical [facility]2020 [facility] Clinical Support from 06[facility]09/2020 [facility]  PHQ-2 Total Score 0 5 4 5  0  PHQ-9 Total Score 3 15 15 16 3     Flowsheet Row [facility] from [facility]/22/2020 [facility]  C-SSRS RISK CATEGORY No Risk No Risk Error: Q3, 4, or 5 should not be populated when Q2 is No       Assessment and Plan: Patient endorses symptoms of mild anxiety and disturbed sleep.  He notes that at this time he does not want to increase mirtazapine but prefers to reduce BuSpar noting that it makes him feels more anxious.  Today he is agreeable to reducing BuSpar 15mg  to 10 mg 3 times a day to help manage anxiety and depression.  He will continue all other medication as prescribed.  No  other concerns at this time.  1. Generalized anxiety disorder  Decreased- busPIRone (BUSPAR) 10 MG tablet; Take 1 tablet (10 mg total) by mouth 3 (three) times daily.  Dispense: 90 tablet; Refill: 2 Continue- mirtazapine (REMERON) 30  MG tablet; Take 1 tablet (30 mg total) by mouth at bedtime.  Dispense: 30 tablet; Refill: 2 Continue- QUEtiapine (SEROQUEL) 300 MG tablet; Take 1 tablet (300 mg total) by mouth at bedtime.  Dispense: 30 tablet; Refill: 2  2. Mild episode of recurrent major depressive disorder (HCC)  Continue- mirtazapine (REMERON) 30 MG tablet; Take 1 tablet (30 mg total) by mouth at bedtime.  Dispense: 30 tablet; Refill: 2 Continue- QUEtiapine (SEROQUEL) 300 MG tablet; Take 1 tablet (300 mg total) by mouth at bedtime.  Dispense: 30 tablet; Refill: 2  3. Arthralgia of right forearm  - Ambulatory referral to Physical Medicine Rehab  4. Alcohol use disorder, severe, dependence (HCC)  Continue- naltrexone (DEPADE) 50 MG tablet; Take 1 tablet (50 mg total) by mouth daily.  Dispense: 30 tablet; Refill: 2   Follow up in 3 months Follow-up with therapy Shanna Cisco, NP 11/25/2020, 4:34 PM

## 2020-12-01 NOTE — Progress Notes (Signed)
   THERAPIST PROGRESS NOTE  Session Time: 45 min  Participation Level: Active  Behavioral Response: CasualAlertMildly anxious  Type of Therapy: Individual Therapy  Treatment Goals addressed: Anxiety and Coping  Interventions: CBT, Solution Focused and Supportive  Summary: Tyrone White is a 35 y.o. male who presents with hx of GAD. This date pt returns for in person session. He reports he is still graduating from [facility] program end of the month. Pt states he has decided he wants to move into his own place in another transitional house. He reports talking with someone named Colvin Caroli also associated with program who advised he could help but pt needed a referral. Pt provides Mr. Solmon Ice phone # and asks LCSW to call. Called the number provided during session and this was an incorrect #. Pt reports he will be seeing Mr. Burna Cash again later today and will get # again. LCSW provided pt with card and number for Mr. Burna Cash to call this LCSW. Discussed pt being very pt with planning for move so as to have a successful move. Pt verbalizes understanding. LCSW facilitated pt having transitional housing list via peer support specialist on sight so he can review options. He reports he is still having sleep problems and is not taking meds as prescribed. Pt seeing med management provider today. He agrees for LCSW to coordinate with provider prior to his appt to communicate findings in session, which was done. Pt denies any substance use of any kind. LCSW commended pt for this choice and provided encouragement to continue with benefits reviewed. Pt reports he has been accepted for Artist program at [facility] with financial aid approved. He is very excited about this stating he starts Aug 15th with goal of graduation 2024. LCSW congratulated pt and shared in his excitement. Pt reports he has been told his abdominal pain is d/t hernia and he has appt with surgeon 05/16. He reports he can recover at  [facility] but will have to pay $300 mon rent. 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 Pt still waiting for title for car and remains in touch with the man who gave him car. Pt denies other worries/concerns. LCSW reviewed poc including scheduling. Pt states appreciation for care.   Suicidal/Homicidal: Nowithout intent/plan  Therapist Response: Pt remains receptive to care.  Plan: Return again in ~4 weeks.  Diagnosis: Axis I: Generalized Anxiety Disorder  Gloucester Sink, LCSW 12/01/2020

## 2020-12-06 ENCOUNTER — Ambulatory Visit: Payer: Self-pay | Admitting: Surgery

## 2020-12-06 ENCOUNTER — Other Ambulatory Visit: Payer: Self-pay | Admitting: Surgery

## 2020-12-06 DIAGNOSIS — R1032 Left lower quadrant pain: Secondary | ICD-10-CM

## 2020-12-17 ENCOUNTER — Encounter (HOSPITAL_COMMUNITY): Payer: Self-pay

## 2020-12-17 ENCOUNTER — Ambulatory Visit (HOSPITAL_COMMUNITY): Admission: RE | Admit: 2020-12-17 | Payer: Self-pay | Source: Ambulatory Visit

## 2020-12-22 ENCOUNTER — Ambulatory Visit: Payer: Self-pay | Admitting: Family Medicine

## 2020-12-27 ENCOUNTER — Ambulatory Visit (HOSPITAL_COMMUNITY): Payer: No Payment, Other | Admitting: Licensed Clinical Social Worker

## 2021-01-03 ENCOUNTER — Other Ambulatory Visit: Payer: Self-pay | Admitting: Surgery

## 2021-01-03 DIAGNOSIS — R1032 Left lower quadrant pain: Secondary | ICD-10-CM

## 2021-01-10 ENCOUNTER — Ambulatory Visit (INDEPENDENT_AMBULATORY_CARE_PROVIDER_SITE_OTHER): Payer: No Payment, Other | Admitting: Licensed Clinical Social Worker

## 2021-01-10 ENCOUNTER — Ambulatory Visit (HOSPITAL_COMMUNITY): Payer: No Payment, Other | Admitting: Licensed Clinical Social Worker

## 2021-01-10 ENCOUNTER — Ambulatory Visit (HOSPITAL_COMMUNITY): Payer: Self-pay | Admitting: Licensed Clinical Social Worker

## 2021-01-10 DIAGNOSIS — F411 Generalized anxiety disorder: Secondary | ICD-10-CM | POA: Diagnosis not present

## 2021-01-13 NOTE — Progress Notes (Signed)
   THERAPIST PROGRESS NOTE   Virtual Visit via Telephone Note  I connected with Tyrone White on 01/10/21 at  8:00 AM EDT by telephone and verified that I am speaking with the correct person using two identifiers.  Location: Patient: At work in parking lot Provider: [facility]   I discussed the limitations, risks, security and privacy concerns of performing an evaluation and management service by telephone and the availability of in person appointments. I also discussed with the patient that there may be a patient responsible charge related to this service. The patient expressed understanding and agreed to proceed. I discussed the assessment and treatment plan with the patient. The patient was provided an opportunity to ask questions and all were answered. The patient agreed with the plan and demonstrated an understanding of the instructions.  I provided 30 minutes of non-face-to-face time during this encounter.  Participation Level: Active  Behavioral Response:  UTAAlertAnxious  Type of Therapy: Individual Therapy  Treatment Goals addressed: Anxiety and Coping  Interventions: Solution Focused and Supportive  Summary: Deren Degrazia is a 35 y.o. male who presents with hx of GAD, subs use dis. Pt has phone appt today. Pt advises he is unclear why he has to do phone appt today and LCSW also unclear. Pt states he is "doing alright". He continues to work on trying to get the car situation managed and says he was advised by the donator that he should have title in ~ 10 days. Pt did graduate from program and advises all his restrictions have been lifted. 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 Pt reports he plans to get his own phone. LCSW asked pt to please call to provide phone #. Pt states he is staying clean of all substances. He reports he is looking forward to starting school in Aug. LCSW assessed for pt having to have hernia surgery. Pt states he had consult with surgeon and  was advised by surgeion he did not have a hernia, no surgery planned. He states he had a groin muscle pulled and with ice he is better, no pain. Pt states he is pleased and had been praying about it. LCSW assessed for meds. Pt states he is not taking Seroquel or Buspar and feels his MH is well managed on Remeron. He is still taking Naltrexone. He intends to keep med management appts. LCSW assessed for correct contact info on Dwayne Nealy r/t housing. Pt is able to provide info/number [phone]. LCSW assured pt clinician would f/u, which was done post session. Mr. Larry Sierras is with [facility]. He explains Transition to [employer] with state funding to assist with rent, etc. Informed on how to make referral online which was also done post session. Pt called with update.   Suicidal/Homicidal: Nowithout intent/plan  Therapist Response: Pt remains receptive to care.  Plan: Return again for next avail appt.  Diagnosis: Axis I: Generalized Anxiety Disorder  [place]ang[place] Sink, LCSW 01/13/2021

## 2021-02-02 ENCOUNTER — Other Ambulatory Visit: Payer: Self-pay

## 2021-02-02 ENCOUNTER — Ambulatory Visit (INDEPENDENT_AMBULATORY_CARE_PROVIDER_SITE_OTHER): Payer: No Payment, Other | Admitting: Licensed Clinical Social Worker

## 2021-02-02 DIAGNOSIS — F411 Generalized anxiety disorder: Secondary | ICD-10-CM

## 2021-02-04 ENCOUNTER — Ambulatory Visit: Payer: Self-pay | Admitting: Nurse Practitioner

## 2021-02-05 NOTE — Progress Notes (Signed)
   THERAPIST PROGRESS NOTE  Session Time: 45 min  Participation Level: Active  Behavioral Response: Neat and Well GroomedAlertAnxious and Euthymic  Type of Therapy: Individual Therapy  Treatment Goals addressed: Anxiety and Coping  Interventions: Solution Focused, Strength-based, Supportive, and Reframing  Summary: Tyrone White is a 35 y.o. male who presents with hx of GAD/addiction problem. This date pt returns for in person session. He is in a positive mood yet still noted to be somewhat restless throughout session. Pt maintains good eye contact and is appropriately responsive with expression, smiles, laughter. Pt provides updates on Transitional Living program. He calls Ottis Stain [phone]) during session, who is the program rep who contacted him after this clinician sent in referral. Jillyn Hidden states app must be completed within 3 wks of referral. Pt needs to apply for Medicaid, which pt did, provide FL2, comprehensive MH eval, income verification. Pt will not meet deadline this time but can reapply. New assessment planned for next session. Pt states at present he wants to continue at [facility]. 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 Pt states when he went to grandfather's funeral he stayed with his aunt and uncle. Aunt reportedly came to get him. LCSW provided grief education/support. He reports aunt/uncle are very proud of him and aunt is going to sell one of her cars to him since the car he was given is not working out. He advises he is going to get her 2008 [employer] Sleight and make payments. He expects to get the car in the coming days. Pt denies all substance use and no desire. He reports ongoing sleep problems and advises he stays up on his phone. LCSW provided edcucation on meds and electronic devices r/t sleep. Pt agrees to keep next med management appt. LCSW reviewed poc including scheduling prior to close of session. Pt  states appreciation for care.    Suicidal/Homicidal: Nowithout intent/plan  Therapist Response: Pt remains receptive to care.  Plan: Return again in ~4 weeks.  Diagnosis: Axis I: Generalized Anxiety Disorder     [place] Sink, LCSW 02/05/2021

## 2021-02-14 ENCOUNTER — Ambulatory Visit (INDEPENDENT_AMBULATORY_CARE_PROVIDER_SITE_OTHER): Payer: Self-pay | Admitting: Nurse Practitioner

## 2021-02-14 VITALS — BP 108/79 | HR 72 | Temp 97.9°F | Resp 18

## 2021-02-14 DIAGNOSIS — J0110 Acute frontal sinusitis, unspecified: Secondary | ICD-10-CM

## 2021-02-14 MED ORDER — PREDNISONE 20 MG PO TABS: 20.0000 mg | ORAL_TABLET | Freq: Every day | ORAL | 0 refills | Status: AC

## 2021-02-14 MED ORDER — AZITHROMYCIN 250 MG PO TABS: ORAL_TABLET | ORAL | 0 refills | Status: AC

## 2021-02-14 NOTE — Assessment & Plan Note (Addendum)
Chest congestion Cough:  Will defer covid testing due to symptom onset 2 weeks ago  Stay well hydrated  Stay active  Deep breathing exercises  May take tylenol for fever or pain  May take mucinex DM twice daily  Will order azithromycin  Will order prednisone     Follow up:  Follow up if needed

## 2021-02-14 NOTE — Progress Notes (Signed)
@Patient  ID: , male    DOB: [birth year], 35 y.o.   MRN: [id number]  Chief Complaint  Patient presents with   sinus congestion    Referring provider: [id number], NP  HPI  Patient presents today for post-COVID care clinic visit.  Patient states that he has been having sinus congestion for the past couple weeks.  He states that he is also having some chest congestion.  He does have postnasal drip.  He was not tested for COVID.  He does not have any significant shortness of breath or any significant fever. Denies f/c/s, n/v/d, hemoptysis, PND, chest pain or edema.      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   "As noted on CT Scan"   Depression     Tobacco History: Social History   Tobacco Use  Smoking Status Some Days   Types: Cigarettes  Smokeless Tobacco Never  Tobacco Comments   "state that he smokes 3 cigarettes a day"   Ready to quit: No Counseling given: Yes Tobacco comments: "state that he smokes 3 cigarettes a day"   Outpatient Encounter Medications as of 02/14/2021  Medication Sig   azithromycin (ZITHROMAX) 250 MG tablet Take 2 tablets on day 1, then 1 tablet daily on days 2 through 5   predniSONE (DELTASONE) 20 MG tablet Take 1 tablet (20 mg total) by mouth daily with breakfast for 5 days.   amoxicillin-clavulanate (AUGMENTIN) 875-125 MG tablet Take 1 tablet by mouth every 12 (twelve) hours.   busPIRone (BUSPAR) 10 MG tablet Take 1 tablet (10 mg total) by mouth 3 (three) times daily.   famotidine (PEPCID) 20 MG tablet Take 1 tablet (20 mg total) by mouth 2 (two) times daily.   loratadine (CLARITIN) 10 MG tablet Take 1 tablet (10 mg total) by mouth daily.   mirtazapine (REMERON) 30 MG tablet Take 1 tablet (30 mg  total) by mouth at bedtime.   naltrexone (DEPADE) 50 MG tablet Take 1 tablet (50 mg total) by mouth daily.   polyvinyl alcohol (LIQUIFILM TEARS) 1.4 % ophthalmic solution Place 1 drop into both eyes as needed for dry eyes.   QUEtiapine (SEROQUEL) 300 MG tablet Take 1 tablet (300 mg total) by mouth at bedtime.   No facility-administered encounter medications on file as of 02/14/2021.     Review of Systems  Review of Systems  Constitutional: Negative.   HENT:  Positive for congestion, postnasal drip, rhinorrhea, sinus pressure and sinus pain. Negative for ear pain.   Respiratory:  Positive for cough. Negative for shortness of breath.   Cardiovascular: Negative.   Gastrointestinal: Negative.   Allergic/Immunologic: Negative.   Neurological: Negative.   Psychiatric/Behavioral: Negative.        Physical Exam  BP 108/79   Pulse 72   Temp 97.9 F (36.6 C)   Resp 18   SpO2 98%   Wt Readings from Last 5 Encounters:  10/12/20 203 lb (92.1 kg)  08/08/20 210 lb (95.3 kg)  06/23/20 190 lb 12.8 oz (86.5 kg)  03/02/20 180 lb 12.8 oz (82 kg)  10/16/19 160 lb (72.6 kg)     Physical Exam Vitals and nursing note reviewed.  Constitutional:      General: He is not in acute distress.    Appearance: He is  well-developed.  HENT:     Nose: Congestion present.     Right Sinus: Maxillary sinus tenderness and frontal sinus tenderness present.     Left Sinus: Maxillary sinus tenderness and frontal sinus tenderness present.  Cardiovascular:     Rate and Rhythm: Normal rate and regular rhythm.  Pulmonary:     Effort: Pulmonary effort is normal.     Breath sounds: Normal breath sounds.  Skin:    General: Skin is warm and dry.  Neurological:     Mental Status: He is alert and oriented to person, place, and time.     Lab Results:  CBC    Component Value Date/Time   WBC 7.0 08/06/2020 1945   RBC 4.84 08/06/2020 1945   HGB 13.9 08/06/2020 1945   HGB 14.2 06/23/2020 1223   HCT 43.6  08/06/2020 1945   HCT 42.9 06/23/2020 1223   PLT 153 08/06/2020 1945   PLT 124 (L) 06/23/2020 1223   MCV 90.1 08/06/2020 1945   MCV 90 06/23/2020 1223   MCH 28.7 08/06/2020 1945   MCHC 31.9 08/06/2020 1945   RDW 12.9 08/06/2020 1945   RDW 11.8 06/23/2020 1223   LYMPHSABS 2.2 06/23/2020 1223   EOSABS 0.2 06/23/2020 1223   BASOSABS 0.0 06/23/2020 1223    BMET    Component Value Date/Time   NA 139 08/10/2020 1821   NA 140 06/23/2020 1223   K 4.1 08/10/2020 1821   CL 102 08/10/2020 1821   CO2 26 08/10/2020 1821   GLUCOSE 114 (H) 08/10/2020 1821   BUN 17 08/10/2020 1821   BUN 12 06/23/2020 1223   CREATININE 0.93 08/10/2020 1821   CALCIUM 10.1 08/10/2020 1821   GFRNONAA >60 08/10/2020 1821   GFRAA 110 06/23/2020 1223      Assessment & Plan:   Acute non-recurrent frontal sinusitis Chest congestion Cough:  Will defer covid testing due to symptom onset 2 weeks ago  Stay well hydrated  Stay active  Deep breathing exercises  May take tylenol for fever or pain  May take mucinex DM twice daily  Will order azithromycin  Will order prednisone     Follow up:  Follow up if needed     Ivonne Andrew, NP 02/14/2021

## 2021-02-14 NOTE — Patient Instructions (Addendum)
Sinusitis Chest congestion Cough:   Stay well hydrated  Stay active  Deep breathing exercises  May take tylenol for fever or pain  May take mucinex DM twice daily  Will order azithromycin  Will order prednisone     Follow up:  Follow up if needed

## 2021-02-23 ENCOUNTER — Other Ambulatory Visit (HOSPITAL_COMMUNITY): Payer: Self-pay | Admitting: Psychiatry

## 2021-02-23 DIAGNOSIS — F330 Major depressive disorder, recurrent, mild: Secondary | ICD-10-CM

## 2021-02-23 DIAGNOSIS — F411 Generalized anxiety disorder: Secondary | ICD-10-CM

## 2021-02-28 ENCOUNTER — Encounter (HOSPITAL_COMMUNITY): Payer: No Payment, Other | Admitting: Psychiatry

## 2021-03-03 ENCOUNTER — Encounter (HOSPITAL_COMMUNITY): Payer: No Payment, Other | Admitting: Psychiatry

## 2021-03-03 ENCOUNTER — Telehealth (HOSPITAL_COMMUNITY): Payer: Self-pay | Admitting: Licensed Clinical Social Worker

## 2021-03-03 ENCOUNTER — Ambulatory Visit (HOSPITAL_COMMUNITY): Payer: No Payment, Other | Admitting: Licensed Clinical Social Worker

## 2021-03-03 NOTE — Telephone Encounter (Signed)
LCSW phoned pt when he failed to show up for 9a appt that was confirmed yesterday. Pt states he is at work. He states he went this morning to get his books for school but they were closed and that is why he did not come. Assessed for how pt is doing. Pt states "I slipped back". He admits to "drinking", denies other use. LCSW provided education and encouragement on getting back on track. LCSW provided referral to [facility] groups at this location and asked him to attend for added support. Reviewed next appt and requested pt be present. Pt agrees and states appreciation for call.

## 2021-03-20 ENCOUNTER — Ambulatory Visit (HOSPITAL_COMMUNITY)
Admission: AD | Admit: 2021-03-20 | Discharge: 2021-03-20 | Disposition: A | Discharge status: Home / Self Care | Payer: Self-pay

## 2021-03-31 ENCOUNTER — Encounter (HOSPITAL_COMMUNITY): Payer: No Payment, Other | Admitting: Psychiatry

## 2021-04-06 ENCOUNTER — Ambulatory Visit (HOSPITAL_COMMUNITY): Payer: No Payment, Other | Admitting: Licensed Clinical Social Worker

## 2021-07-13 ENCOUNTER — Encounter: Payer: Self-pay | Admitting: Nurse Practitioner

## 2021-07-20 ENCOUNTER — Encounter: Payer: Self-pay | Admitting: Nurse Practitioner

## 2022-08-02 IMAGING — DX DG HAND COMPLETE 3+V*R*
3 series · 3 of 3 positions shown · non-contrast
Comparison: None.

CLINICAL DATA: Pain following fall

EXAM:
RIGHT HAND - COMPLETE 3+ VIEW

[hand pa]
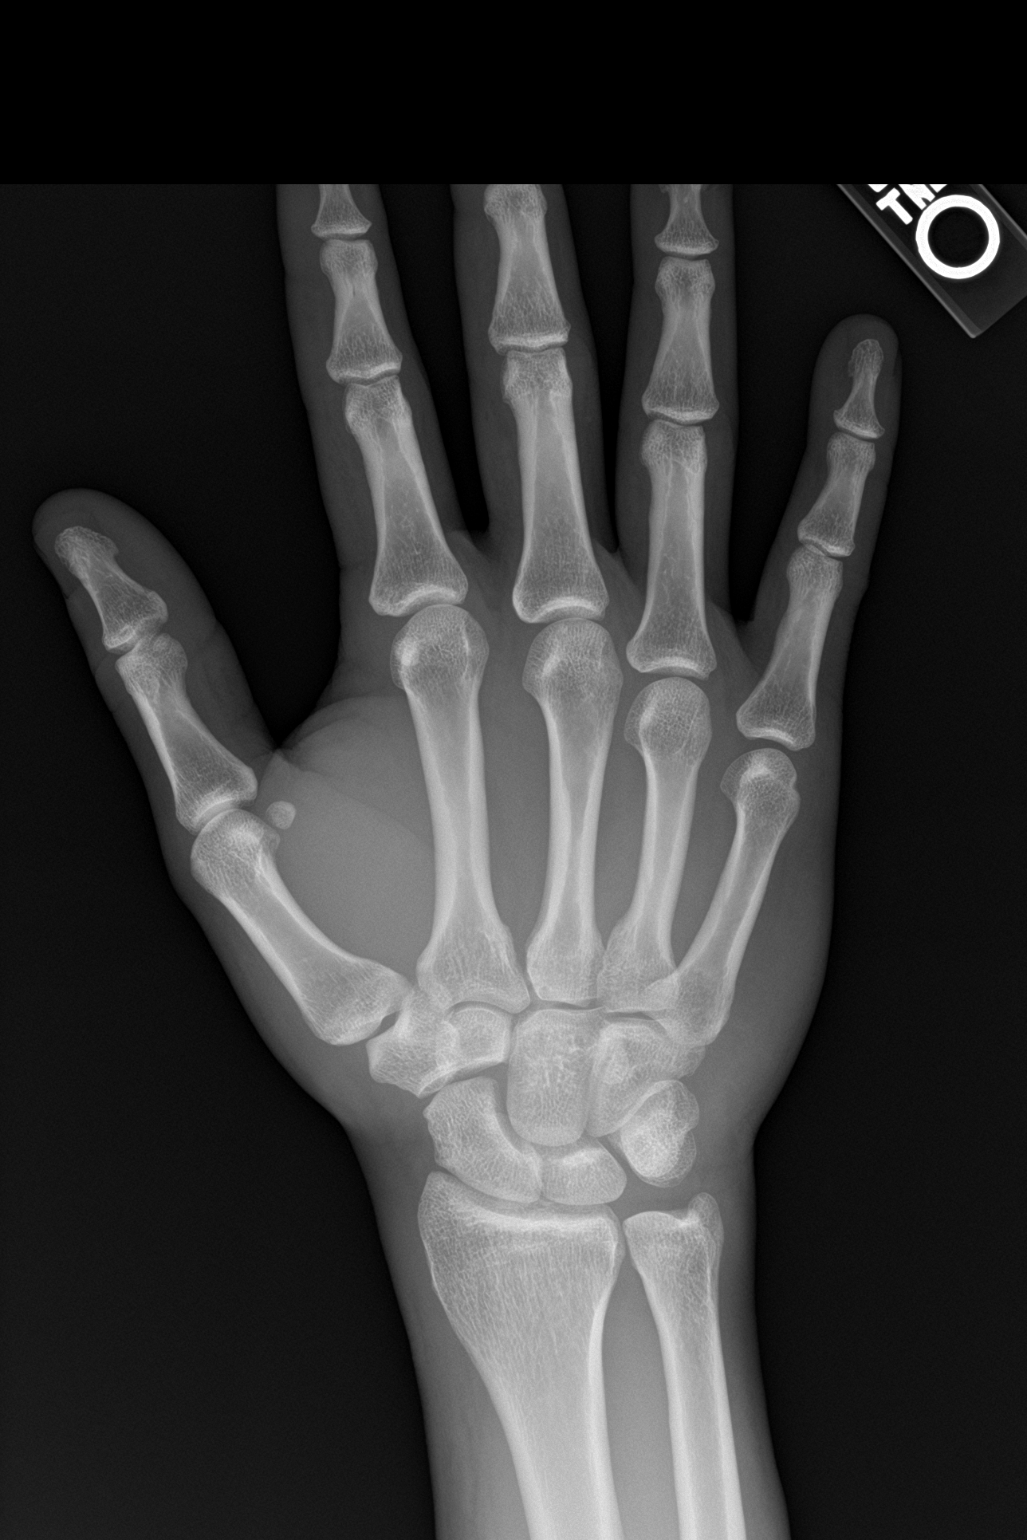

[hand obl]
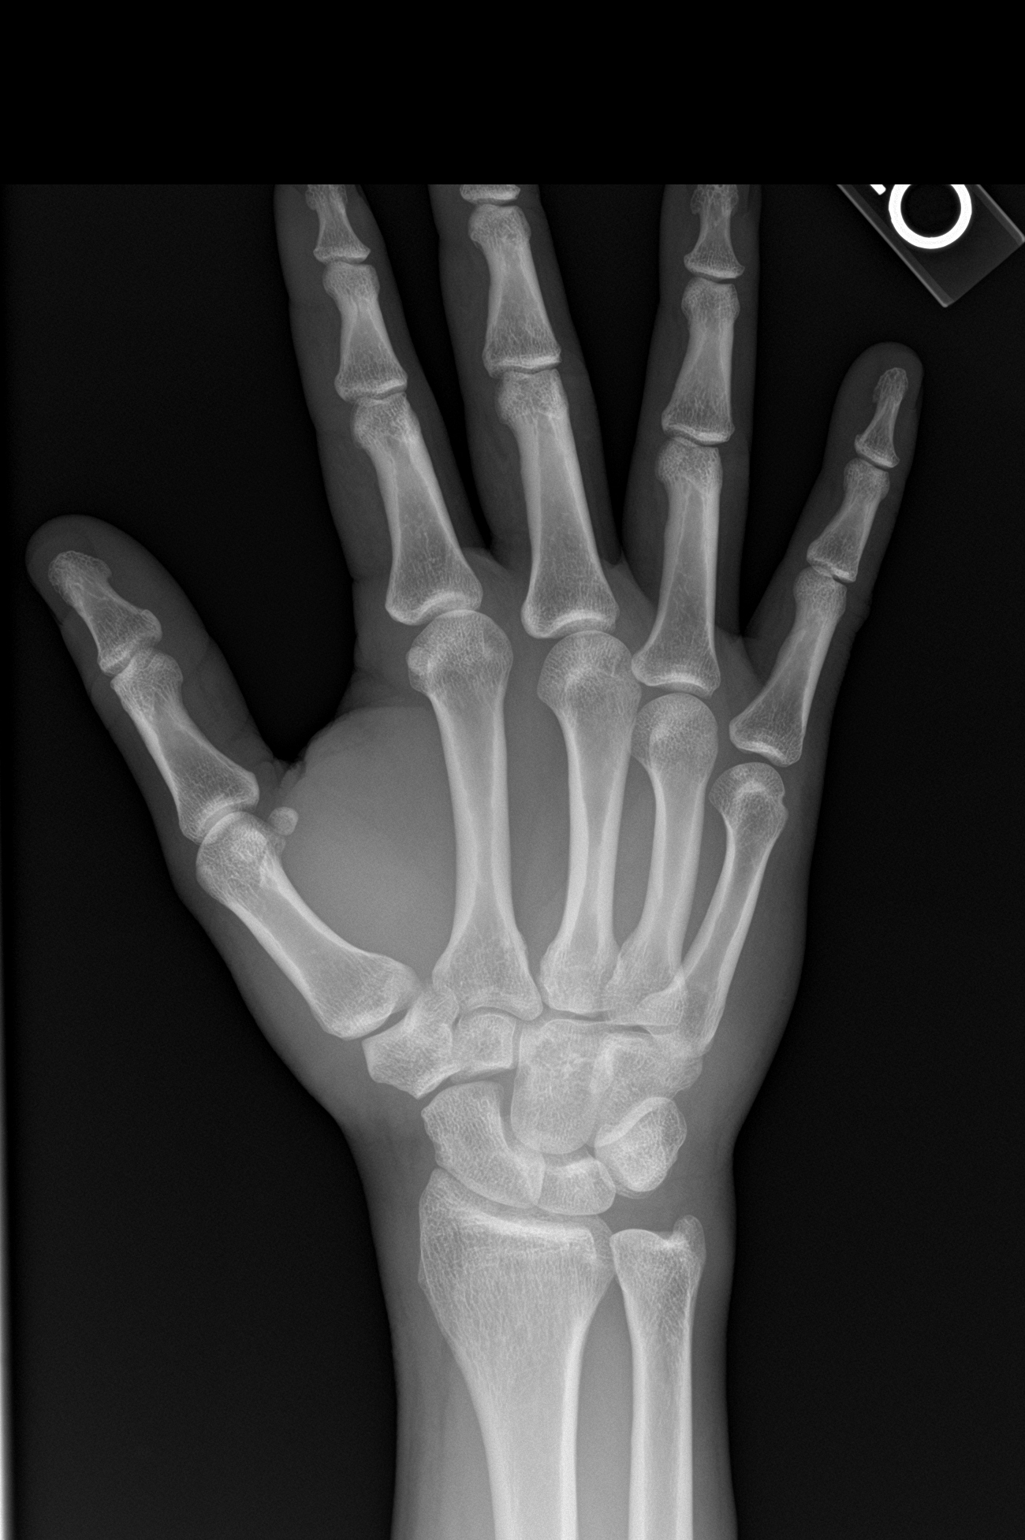

[hand lat]
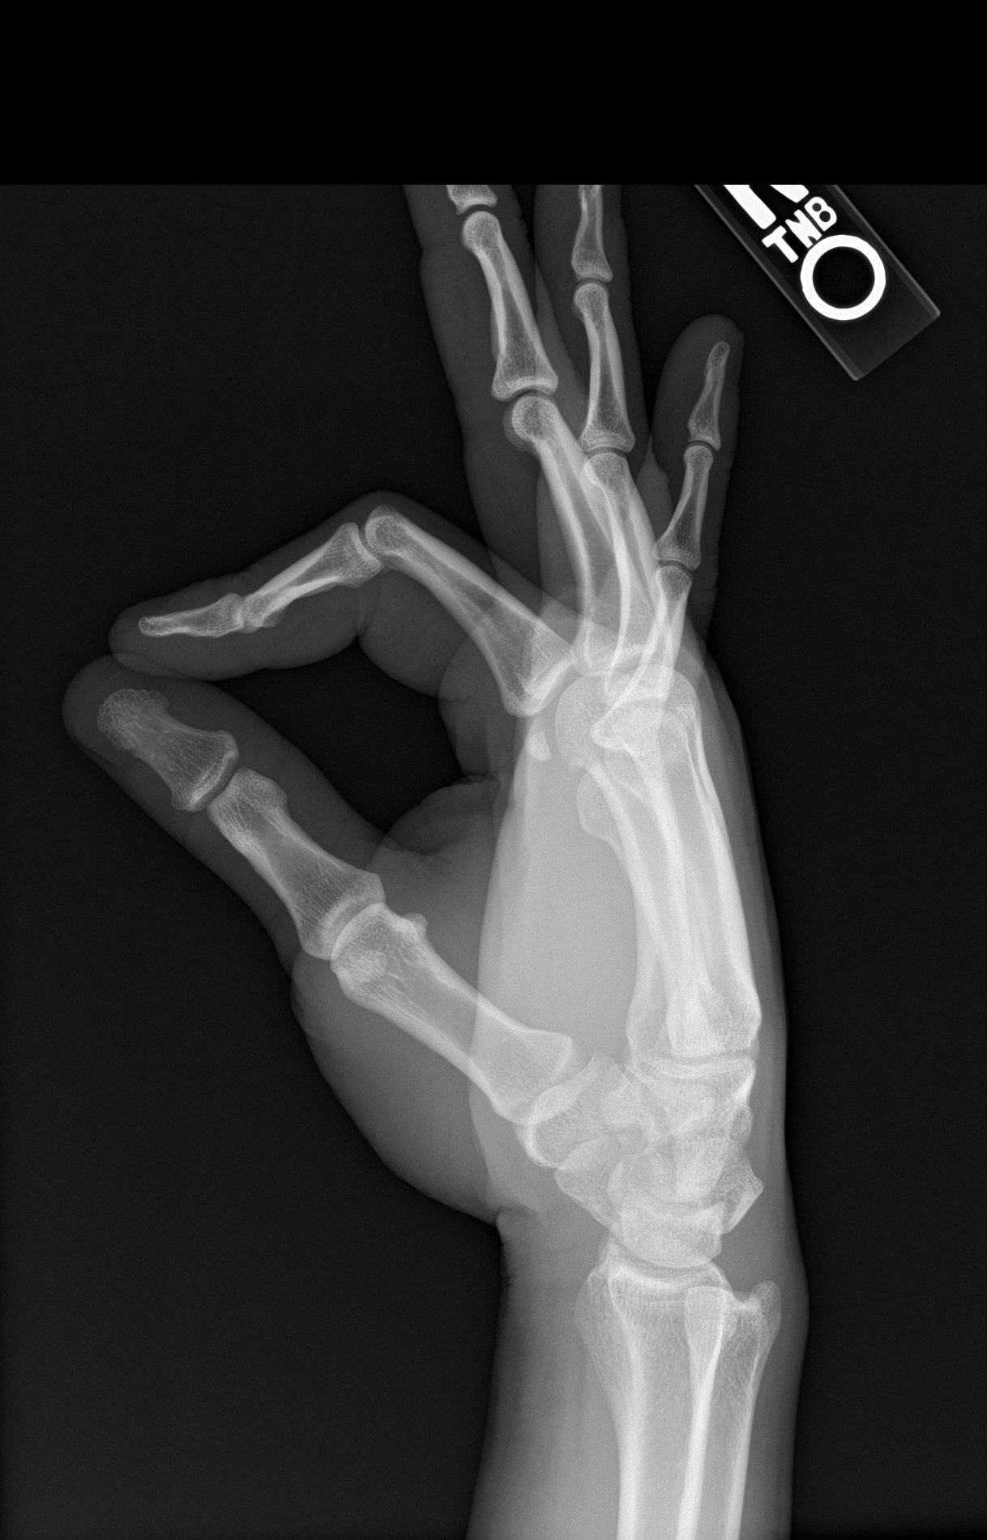

[3 of 3 positions shown; findings below may reference images not displayed]

FINDINGS: Frontal, oblique, and lateral views were obtained. No fracture or
dislocation. Joint spaces appear normal. No erosive change.
IMPRESSION: No fracture or dislocation.  No appreciable arthropathic change.

## 2023-11-21 ENCOUNTER — Ambulatory Visit (HOSPITAL_COMMUNITY): Admission: EM | Admit: 2023-11-21 | Discharge: 2023-11-21 | Disposition: A | Discharge status: Home / Self Care

## 2023-11-21 DIAGNOSIS — Z760 Encounter for issue of repeat prescription: Secondary | ICD-10-CM | POA: Diagnosis not present
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
## 2023-11-21 NOTE — ED Notes (Signed)
 Patient A&O x 4, ambulatory. Patient discharged in no acute distress. Patient denied SI/HI, A/VH upon discharge. Patient verbalized understanding of all discharge instructions reviewed on AVS via staff, to include follow up appointment recommendations  and safety.  Patient escorted to lobby via staff upon discharge. Safety maintained.

## 2023-11-21 NOTE — Discharge Instructions (Signed)
 Follow up with [facility] Residents Only  Walk-in hours for open access (medication management and therapy) are Monday - Friday 8 am to 11 am. Appointments are limited, so please arrive at 7:00 am. Upon arrival, please complete the form on the clipboard located at the front desk. If there are no clipboards available, all appointments have been filled for that day.  [facility] [address] [place]  [address] [phone]   If experiencing worsening of psychiatry symptoms including suicidal ideations, homicidal ideations, or having auditory/visual hallucinations, etc, to call 911, [phone], come back to this location, or go to the nearest ER.
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
## 2024-01-21 ENCOUNTER — Other Ambulatory Visit: Payer: Self-pay

## 2024-01-21 ENCOUNTER — Encounter (HOSPITAL_COMMUNITY): Payer: Self-pay | Admitting: Family Medicine

## 2024-01-21 ENCOUNTER — Inpatient Hospital Stay (HOSPITAL_COMMUNITY)
Admission: AD | Admit: 2024-01-21 | Discharge: 2024-01-27 | DRG: 897 | Disposition: A | Discharge status: Home / Self Care | Source: Intra-hospital | Attending: Psychiatry | Admitting: Psychiatry

## 2024-01-21 ENCOUNTER — Ambulatory Visit (HOSPITAL_COMMUNITY)
Admission: EM | Admit: 2024-01-21 | Discharge: 2024-01-21 | Disposition: A | Discharge status: Other Acute Inpatient Hospital | Attending: Family Medicine | Admitting: Family Medicine

## 2024-01-21 DIAGNOSIS — F411 Generalized anxiety disorder: Secondary | ICD-10-CM

## 2024-01-21 DIAGNOSIS — F19129 Other psychoactive substance abuse with intoxication, unspecified: Secondary | ICD-10-CM | POA: Diagnosis present

## 2024-01-21 DIAGNOSIS — I498 Other specified cardiac arrhythmias: Secondary | ICD-10-CM | POA: Insufficient documentation

## 2024-01-21 DIAGNOSIS — F1094 Alcohol use, unspecified with alcohol-induced mood disorder: Principal | ICD-10-CM | POA: Diagnosis present

## 2024-01-21 DIAGNOSIS — Y901 Blood alcohol level of 20-39 mg/100 ml: Secondary | ICD-10-CM | POA: Diagnosis present

## 2024-01-21 DIAGNOSIS — R45851 Suicidal ideations: Secondary | ICD-10-CM | POA: Diagnosis present

## 2024-01-21 DIAGNOSIS — Z5986 Financial insecurity: Secondary | ICD-10-CM | POA: Diagnosis not present

## 2024-01-21 DIAGNOSIS — F1014 Alcohol abuse with alcohol-induced mood disorder: Secondary | ICD-10-CM | POA: Diagnosis present

## 2024-01-21 DIAGNOSIS — F332 Major depressive disorder, recurrent severe without psychotic features: Secondary | ICD-10-CM

## 2024-01-21 DIAGNOSIS — Z818 Family history of other mental and behavioral disorders: Secondary | ICD-10-CM

## 2024-01-21 DIAGNOSIS — Z91199 Patient's noncompliance with other medical treatment and regimen due to unspecified reason: Secondary | ICD-10-CM | POA: Diagnosis not present

## 2024-01-21 DIAGNOSIS — Z5941 Food insecurity: Secondary | ICD-10-CM

## 2024-01-21 DIAGNOSIS — Z79899 Other long term (current) drug therapy: Secondary | ICD-10-CM

## 2024-01-21 DIAGNOSIS — F22 Delusional disorders: Secondary | ICD-10-CM | POA: Diagnosis present

## 2024-01-21 DIAGNOSIS — F418 Other specified anxiety disorders: Secondary | ICD-10-CM | POA: Diagnosis present

## 2024-01-21 DIAGNOSIS — Z560 Unemployment, unspecified: Secondary | ICD-10-CM

## 2024-01-21 DIAGNOSIS — F39 Unspecified mood [affective] disorder: Secondary | ICD-10-CM | POA: Diagnosis not present

## 2024-01-21 DIAGNOSIS — Z0283 Encounter for blood-alcohol and blood-drug test: Secondary | ICD-10-CM | POA: Diagnosis not present

## 2024-01-21 DIAGNOSIS — F17210 Nicotine dependence, cigarettes, uncomplicated: Secondary | ICD-10-CM | POA: Diagnosis present

## 2024-01-21 DIAGNOSIS — G4700 Insomnia, unspecified: Secondary | ICD-10-CM | POA: Diagnosis present

## 2024-01-21 DIAGNOSIS — Z5900 Homelessness unspecified: Secondary | ICD-10-CM

## 2024-01-21 DIAGNOSIS — Z8249 Family history of ischemic heart disease and other diseases of the circulatory system: Secondary | ICD-10-CM | POA: Diagnosis not present

## 2024-01-21 DIAGNOSIS — F1120 Opioid dependence, uncomplicated: Secondary | ICD-10-CM

## 2024-01-21 DIAGNOSIS — F1520 Other stimulant dependence, uncomplicated: Secondary | ICD-10-CM | POA: Diagnosis not present

## 2024-01-21 LAB — CBC WITH DIFFERENTIAL/PLATELET
Abs Immature Granulocytes: 0.01 10*3/uL (ref 0.00–0.07)
Basophils Absolute: 0 10*3/uL (ref 0.0–0.1)
Basophils Relative: 1 %
Eosinophils Absolute: 0.1 10*3/uL (ref 0.0–0.5)
Eosinophils Relative: 2 %
HCT: 46.2 % (ref 39.0–52.0)
Hemoglobin: 14.9 g/dL (ref 13.0–17.0)
Immature Granulocytes: 0 %
Lymphocytes Relative: 28 %
Lymphs Abs: 1.4 10*3/uL (ref 0.7–4.0)
MCH: 30 pg (ref 26.0–34.0)
MCHC: 32.3 g/dL (ref 30.0–36.0)
MCV: 93 fL (ref 80.0–100.0)
Monocytes Absolute: 0.4 10*3/uL (ref 0.1–1.0)
Monocytes Relative: 8 %
Neutro Abs: 3 10*3/uL (ref 1.7–7.7)
Neutrophils Relative %: 61 %
Platelets: 204 10*3/uL (ref 150–400)
RBC: 4.97 MIL/uL (ref 4.22–5.81)
RDW: 12.6 % (ref 11.5–15.5)
WBC: 4.9 10*3/uL (ref 4.0–10.5)
nRBC: 0 % (ref 0.0–0.2)

## 2024-01-21 LAB — HEMOGLOBIN A1C
Hgb A1c MFr Bld: 5.8 % — ABNORMAL HIGH (ref 4.8–5.6)
Mean Plasma Glucose: 119.76 mg/dL

## 2024-01-21 LAB — POCT URINE DRUG SCREEN - MANUAL ENTRY (I-SCREEN)
POC Amphetamine UR: NOT DETECTED
POC Buprenorphine (BUP): POSITIVE — AB
POC Cocaine UR: NOT DETECTED
POC Marijuana UR: POSITIVE — AB
POC Methadone UR: NOT DETECTED
POC Methamphetamine UR: POSITIVE — AB
POC Morphine: NOT DETECTED
POC Oxazepam (BZO): NOT DETECTED
POC Oxycodone UR: POSITIVE — AB
POC Secobarbital (BAR): NOT DETECTED

## 2024-01-21 LAB — LIPID PANEL
Cholesterol: 176 mg/dL (ref 0–200)
HDL: 92 mg/dL (ref >40)
LDL Cholesterol: 76 mg/dL (ref 0–99)
Total CHOL/HDL Ratio: 1.9 ratio
Triglycerides: 42 mg/dL (ref <150)
VLDL: 8 mg/dL (ref 0–40)

## 2024-01-21 LAB — COMPREHENSIVE METABOLIC PANEL WITH GFR
ALT: 45 U/L — ABNORMAL HIGH (ref 0–44)
AST: 54 U/L — ABNORMAL HIGH (ref 15–41)
Albumin: 4.6 g/dL (ref 3.5–5.0)
Alkaline Phosphatase: 75 U/L (ref 38–126)
Anion gap: 12 (ref 5–15)
BUN: <5 mg/dL — ABNORMAL LOW (ref 6–20)
CO2: 29 mmol/L (ref 22–32)
Calcium: 9.7 mg/dL (ref 8.9–10.3)
Chloride: 98 mmol/L (ref 98–111)
Creatinine, Ser: 0.79 mg/dL (ref 0.61–1.24)
GFR, Estimated: >60 mL/min (ref >60)
Glucose, Bld: 87 mg/dL (ref 70–99)
Potassium: 4.4 mmol/L (ref 3.5–5.1)
Sodium: 139 mmol/L (ref 135–145)
Total Bilirubin: 1 mg/dL (ref 0.0–1.2)
Total Protein: 7.6 g/dL (ref 6.5–8.1)

## 2024-01-21 LAB — ETHANOL: Alcohol, Ethyl (B): 37 mg/dL — ABNORMAL HIGH (ref <15)

## 2024-01-21 LAB — TSH: TSH: 0.376 u[IU]/mL (ref 0.350–4.500)

## 2024-01-21 LAB — MAGNESIUM: Magnesium: 2.2 mg/dL (ref 1.7–2.4)

## 2024-01-21 MED ORDER — LOPERAMIDE HCL 2 MG PO CAPS: 2.0000 mg | ORAL_CAPSULE | ORAL | Status: DC | PRN

## 2024-01-21 MED ORDER — DIPHENHYDRAMINE HCL 50 MG PO CAPS: 50.0000 mg | ORAL_CAPSULE | Freq: Three times a day (TID) | ORAL | Status: DC | PRN

## 2024-01-21 MED ORDER — CLONIDINE HCL 0.1 MG PO TABS
0.1000 mg | ORAL_TABLET | ORAL | Status: AC
  Administered 2024-01-23 – 2024-01-25 (×4): 0.1 mg via ORAL
  Filled 2024-01-21 (×4): qty 1

## 2024-01-21 MED ORDER — LORAZEPAM 1 MG PO TABS: 1.0000 mg | ORAL_TABLET | Freq: Every day | ORAL | Status: DC

## 2024-01-21 MED ORDER — LORAZEPAM 2 MG/ML IJ SOLN: 2.0000 mg | Freq: Three times a day (TID) | INTRAMUSCULAR | Status: DC | PRN

## 2024-01-21 MED ORDER — ALUM & MAG HYDROXIDE-SIMETH 200-200-20 MG/5ML PO SUSP: 30.0000 mL | ORAL | Status: DC | PRN

## 2024-01-21 MED ORDER — QUETIAPINE FUMARATE 50 MG PO TABS
50.0000 mg | ORAL_TABLET | Freq: Every day | ORAL | Status: DC
  Administered 2024-01-22 – 2024-01-23 (×2): 50 mg via ORAL
  Filled 2024-01-21 (×2): qty 1

## 2024-01-21 MED ORDER — HALOPERIDOL LACTATE 5 MG/ML IJ SOLN: 10.0000 mg | Freq: Three times a day (TID) | INTRAMUSCULAR | Status: DC | PRN

## 2024-01-21 MED ORDER — NAPROXEN 500 MG PO TABS
500.0000 mg | ORAL_TABLET | Freq: Two times a day (BID) | ORAL | Status: AC | PRN
  Administered 2024-01-25 – 2024-01-26 (×3): 500 mg via ORAL
  Filled 2024-01-21 (×3): qty 1

## 2024-01-21 MED ORDER — VITAMIN B-1 100 MG PO TABS
100.0000 mg | ORAL_TABLET | Freq: Every day | ORAL | Status: DC
  Administered 2024-01-22 – 2024-01-27 (×6): 100 mg via ORAL
  Filled 2024-01-21 (×6): qty 1

## 2024-01-21 MED ORDER — THIAMINE HCL 100 MG/ML IJ SOLN
100.0000 mg | Freq: Once | INTRAMUSCULAR | Status: DC
  Filled 2024-01-21: qty 2

## 2024-01-21 MED ORDER — ACETAMINOPHEN 325 MG PO TABS: 650.0000 mg | ORAL_TABLET | Freq: Four times a day (QID) | ORAL | Status: DC | PRN

## 2024-01-21 MED ORDER — HYDROXYZINE HCL 25 MG PO TABS: 25.0000 mg | ORAL_TABLET | Freq: Four times a day (QID) | ORAL | Status: DC | PRN

## 2024-01-21 MED ORDER — LORAZEPAM 1 MG PO TABS
1.0000 mg | ORAL_TABLET | Freq: Three times a day (TID) | ORAL | Status: AC
  Administered 2024-01-22 – 2024-01-23 (×3): 1 mg via ORAL
  Filled 2024-01-21 (×3): qty 1

## 2024-01-21 MED ORDER — HYDROXYZINE HCL 25 MG PO TABS
25.0000 mg | ORAL_TABLET | Freq: Four times a day (QID) | ORAL | Status: AC | PRN
  Administered 2024-01-23: 25 mg via ORAL
  Filled 2024-01-21 (×2): qty 1

## 2024-01-21 MED ORDER — DIPHENHYDRAMINE HCL 25 MG PO CAPS: 50.0000 mg | ORAL_CAPSULE | Freq: Three times a day (TID) | ORAL | Status: DC | PRN

## 2024-01-21 MED ORDER — THIAMINE MONONITRATE 100 MG PO TABS
100.0000 mg | ORAL_TABLET | Freq: Every day | ORAL | Status: DC
Start: 2024-01-22 — End: 2024-01-21

## 2024-01-21 MED ORDER — CLONIDINE HCL 0.1 MG PO TABS
0.1000 mg | ORAL_TABLET | ORAL | Status: DC
Start: 2024-01-23 — End: 2024-01-21

## 2024-01-21 MED ORDER — CLONIDINE HCL 0.1 MG PO TABS
0.1000 mg | ORAL_TABLET | Freq: Every day | ORAL | Status: AC
  Administered 2024-01-26 – 2024-01-27 (×2): 0.1 mg via ORAL
  Filled 2024-01-21 (×2): qty 1

## 2024-01-21 MED ORDER — QUETIAPINE FUMARATE 50 MG PO TABS
50.0000 mg | ORAL_TABLET | Freq: Every day | ORAL | Status: DC
  Administered 2024-01-21: 50 mg via ORAL
  Filled 2024-01-21: qty 1

## 2024-01-21 MED ORDER — METHOCARBAMOL 500 MG PO TABS: 500.0000 mg | ORAL_TABLET | Freq: Three times a day (TID) | ORAL | Status: DC | PRN

## 2024-01-21 MED ORDER — DIPHENHYDRAMINE HCL 50 MG/ML IJ SOLN: 50.0000 mg | Freq: Three times a day (TID) | INTRAMUSCULAR | Status: DC | PRN

## 2024-01-21 MED ORDER — ONDANSETRON 4 MG PO TBDP: 4.0000 mg | ORAL_TABLET | Freq: Four times a day (QID) | ORAL | Status: AC | PRN

## 2024-01-21 MED ORDER — LORAZEPAM 1 MG PO TABS
1.0000 mg | ORAL_TABLET | Freq: Four times a day (QID) | ORAL | Status: DC | PRN
Start: 2024-01-21 — End: 2024-01-21

## 2024-01-21 MED ORDER — LORAZEPAM 1 MG PO TABS: 1.0000 mg | ORAL_TABLET | Freq: Three times a day (TID) | ORAL | Status: DC

## 2024-01-21 MED ORDER — ADULT MULTIVITAMIN W/MINERALS CH
1.0000 | ORAL_TABLET | Freq: Every day | ORAL | Status: DC
  Administered 2024-01-22 – 2024-01-27 (×6): 1 via ORAL
  Filled 2024-01-21 (×6): qty 1

## 2024-01-21 MED ORDER — CLONIDINE HCL 0.1 MG PO TABS: 0.1000 mg | ORAL_TABLET | Freq: Every day | ORAL | Status: DC

## 2024-01-21 MED ORDER — LORAZEPAM 1 MG PO TABS
1.0000 mg | ORAL_TABLET | Freq: Four times a day (QID) | ORAL | Status: AC
  Administered 2024-01-21 – 2024-01-22 (×3): 1 mg via ORAL
  Filled 2024-01-21 (×3): qty 1

## 2024-01-21 MED ORDER — LOPERAMIDE HCL 2 MG PO CAPS: 2.0000 mg | ORAL_CAPSULE | ORAL | Status: AC | PRN

## 2024-01-21 MED ORDER — METHOCARBAMOL 500 MG PO TABS
500.0000 mg | ORAL_TABLET | Freq: Three times a day (TID) | ORAL | Status: AC | PRN
  Administered 2024-01-22 – 2024-01-25 (×3): 500 mg via ORAL
  Filled 2024-01-21 (×3): qty 1

## 2024-01-21 MED ORDER — LORAZEPAM 2 MG/ML IJ SOLN
2.0000 mg | Freq: Three times a day (TID) | INTRAMUSCULAR | Status: DC | PRN
  Filled 2024-01-21: qty 1

## 2024-01-21 MED ORDER — LORAZEPAM 1 MG PO TABS
1.0000 mg | ORAL_TABLET | Freq: Two times a day (BID) | ORAL | Status: AC
  Administered 2024-01-23 – 2024-01-24 (×2): 1 mg via ORAL
  Filled 2024-01-21 (×2): qty 1

## 2024-01-21 MED ORDER — BUSPIRONE HCL 10 MG PO TABS
10.0000 mg | ORAL_TABLET | Freq: Two times a day (BID) | ORAL | Status: DC
  Administered 2024-01-21: 10 mg via ORAL
  Filled 2024-01-21: qty 1

## 2024-01-21 MED ORDER — NALTREXONE HCL 50 MG PO TABS
25.0000 mg | ORAL_TABLET | Freq: Every day | ORAL | Status: DC
  Administered 2024-01-25 – 2024-01-27 (×3): 25 mg via ORAL
  Filled 2024-01-21 (×3): qty 1

## 2024-01-21 MED ORDER — DICYCLOMINE HCL 20 MG PO TABS
20.0000 mg | ORAL_TABLET | Freq: Four times a day (QID) | ORAL | Status: DC | PRN
Start: 2024-01-21 — End: 2024-01-21

## 2024-01-21 MED ORDER — MAGNESIUM HYDROXIDE 400 MG/5ML PO SUSP: 30.0000 mL | Freq: Every day | ORAL | Status: DC | PRN

## 2024-01-21 MED ORDER — LORAZEPAM 1 MG PO TABS
1.0000 mg | ORAL_TABLET | Freq: Two times a day (BID) | ORAL | Status: DC
Start: 2024-01-23 — End: 2024-01-21

## 2024-01-21 MED ORDER — HALOPERIDOL 5 MG PO TABS
5.0000 mg | ORAL_TABLET | Freq: Three times a day (TID) | ORAL | Status: DC | PRN
Start: 2024-01-21 — End: 2024-01-27

## 2024-01-21 MED ORDER — NICOTINE 21 MG/24HR TD PT24
21.0000 mg | MEDICATED_PATCH | Freq: Every day | TRANSDERMAL | Status: DC
  Administered 2024-01-25 – 2024-01-26 (×2): 21 mg via TRANSDERMAL
  Filled 2024-01-21 (×3): qty 1

## 2024-01-21 MED ORDER — BUSPIRONE HCL 10 MG PO TABS
10.0000 mg | ORAL_TABLET | Freq: Two times a day (BID) | ORAL | Status: DC
  Administered 2024-01-21 – 2024-01-27 (×12): 10 mg via ORAL
  Filled 2024-01-21 (×12): qty 1

## 2024-01-21 MED ORDER — TRAZODONE HCL 50 MG PO TABS: 50.0000 mg | ORAL_TABLET | Freq: Every evening | ORAL | Status: DC | PRN

## 2024-01-21 MED ORDER — ADULT MULTIVITAMIN W/MINERALS CH
1.0000 | ORAL_TABLET | Freq: Every day | ORAL | Status: DC
  Administered 2024-01-21: 1 via ORAL
  Filled 2024-01-21: qty 1

## 2024-01-21 MED ORDER — ONDANSETRON 4 MG PO TBDP: 4.0000 mg | ORAL_TABLET | Freq: Four times a day (QID) | ORAL | Status: DC | PRN

## 2024-01-21 MED ORDER — NAPROXEN 500 MG PO TABS
500.0000 mg | ORAL_TABLET | Freq: Two times a day (BID) | ORAL | Status: DC | PRN
Start: 2024-01-21 — End: 2024-01-21
  Administered 2024-01-21: 500 mg via ORAL
  Filled 2024-01-21: qty 1

## 2024-01-21 MED ORDER — CLONIDINE HCL 0.1 MG PO TABS
0.1000 mg | ORAL_TABLET | Freq: Four times a day (QID) | ORAL | Status: DC
  Administered 2024-01-21: 0.1 mg via ORAL
  Filled 2024-01-21: qty 1

## 2024-01-21 MED ORDER — NALTREXONE HCL 50 MG PO TABS: 25.0000 mg | ORAL_TABLET | Freq: Every day | ORAL | Status: DC

## 2024-01-21 MED ORDER — ACETAMINOPHEN 325 MG PO TABS
650.0000 mg | ORAL_TABLET | Freq: Four times a day (QID) | ORAL | Status: DC | PRN
  Administered 2024-01-22 – 2024-01-25 (×3): 650 mg via ORAL
  Filled 2024-01-21 (×3): qty 2

## 2024-01-21 MED ORDER — HYDROXYZINE HCL 25 MG PO TABS
25.0000 mg | ORAL_TABLET | Freq: Four times a day (QID) | ORAL | Status: AC | PRN
  Administered 2024-01-22 – 2024-01-26 (×7): 25 mg via ORAL
  Filled 2024-01-21 (×6): qty 1

## 2024-01-21 MED ORDER — QUETIAPINE FUMARATE 50 MG PO TABS
250.0000 mg | ORAL_TABLET | Freq: Every day | ORAL | Status: DC
  Administered 2024-01-21: 250 mg via ORAL
  Filled 2024-01-21: qty 1

## 2024-01-21 MED ORDER — HALOPERIDOL 5 MG PO TABS
5.0000 mg | ORAL_TABLET | Freq: Three times a day (TID) | ORAL | Status: DC | PRN
Start: 2024-01-21 — End: 2024-01-21

## 2024-01-21 MED ORDER — DIPHENHYDRAMINE HCL 50 MG/ML IJ SOLN
50.0000 mg | Freq: Three times a day (TID) | INTRAMUSCULAR | Status: DC | PRN
  Administered 2024-01-25 – 2024-01-26 (×2): 50 mg via INTRAMUSCULAR

## 2024-01-21 MED ORDER — CLONIDINE HCL 0.1 MG PO TABS
0.1000 mg | ORAL_TABLET | Freq: Four times a day (QID) | ORAL | Status: AC
  Administered 2024-01-21 – 2024-01-23 (×7): 0.1 mg via ORAL
  Filled 2024-01-21 (×7): qty 1

## 2024-01-21 MED ORDER — LORAZEPAM 1 MG PO TABS: 1.0000 mg | ORAL_TABLET | Freq: Four times a day (QID) | ORAL | Status: AC | PRN

## 2024-01-21 MED ORDER — LORAZEPAM 1 MG PO TABS
1.0000 mg | ORAL_TABLET | Freq: Four times a day (QID) | ORAL | Status: DC
  Administered 2024-01-21: 1 mg via ORAL
  Filled 2024-01-21: qty 1

## 2024-01-21 MED ORDER — THIAMINE MONONITRATE 100 MG PO TABS
100.0000 mg | ORAL_TABLET | Freq: Every day | ORAL | Status: DC
  Administered 2024-01-21: 100 mg via ORAL
  Filled 2024-01-21: qty 1

## 2024-01-21 MED ORDER — HALOPERIDOL LACTATE 5 MG/ML IJ SOLN: 5.0000 mg | Freq: Three times a day (TID) | INTRAMUSCULAR | Status: DC | PRN

## 2024-01-21 MED ORDER — TRAZODONE HCL 50 MG PO TABS
50.0000 mg | ORAL_TABLET | Freq: Every evening | ORAL | Status: DC | PRN
  Administered 2024-01-24: 50 mg via ORAL
  Filled 2024-01-21 (×4): qty 1

## 2024-01-21 MED ORDER — NICOTINE 21 MG/24HR TD PT24
21.0000 mg | MEDICATED_PATCH | Freq: Every day | TRANSDERMAL | Status: DC
  Administered 2024-01-21: 21 mg via TRANSDERMAL
  Filled 2024-01-21: qty 1

## 2024-01-21 MED ORDER — HALOPERIDOL LACTATE 5 MG/ML IJ SOLN
5.0000 mg | Freq: Three times a day (TID) | INTRAMUSCULAR | Status: DC | PRN
  Administered 2024-01-25: 5 mg via INTRAMUSCULAR
  Filled 2024-01-21: qty 1

## 2024-01-21 MED ORDER — DIPHENHYDRAMINE HCL 50 MG/ML IJ SOLN
50.0000 mg | Freq: Three times a day (TID) | INTRAMUSCULAR | Status: DC | PRN
  Filled 2024-01-21 (×2): qty 1

## 2024-01-21 MED ORDER — DICYCLOMINE HCL 20 MG PO TABS: 20.0000 mg | ORAL_TABLET | Freq: Four times a day (QID) | ORAL | Status: AC | PRN

## 2024-01-21 MED ORDER — LORAZEPAM 2 MG/ML IJ SOLN
2.0000 mg | Freq: Three times a day (TID) | INTRAMUSCULAR | Status: DC | PRN
  Administered 2024-01-25: 2 mg via INTRAMUSCULAR

## 2024-01-21 MED ORDER — QUETIAPINE FUMARATE 50 MG PO TABS: 250.0000 mg | ORAL_TABLET | Freq: Every day | ORAL | Status: DC

## 2024-01-21 NOTE — Group Note (Signed)
 Date:  01/21/2024 Time:  8:44 PM  Group Topic/Focus:  Wrap-Up Group:   The focus of this group is to help patients review their daily goal of treatment and discuss progress on daily workbooks. Alcoholics Anonymous (AA) Meeting    Participation Level:  Active  Participation Quality:  Appropriate  Affect:  Appropriate  Cognitive:  Appropriate  Insight: Appropriate  Engagement in Group:  Engaged  Modes of Intervention:  Discussion and Support  Additional Comments:  Patient attended AA  Eward Mace 01/21/2024, 8:44 PM

## 2024-01-21 NOTE — Progress Notes (Signed)
 Patient ID: Chrishun Scheer, male   DOB: 1985-11-11, 38 y.o.   MRN: [id number] Patient is a 38 year old male admitted to the unit on under a voluntary status. Patient has a history of MDD, severe ETOH use disorder, and anxiety. Patient complains of needing help to stop drinking. And is endorsing SI with a plan to jump off of a building or to rob a bank to commit suicide by [facility].   Patient reports drinking 12 24 ounce cans of beer daily for the last 6 months with his last drink being this morning. Patient also reports using cocaine 1=2 times a week and percocet shen he can get them. Patient was noted to have a UDS which resulted positive for oxycodone and bupronorphene. During assessment patient admitted to cocaine, percocet, bupronorphene, marijuana, and methamphetamine use.   Skin assessment complete and patient noted to have light scratches on his chest and deeper scratches on his arm . Patient attributed the scratches to scratching himself while sleep and an accident at work. No further injuries noted on skin.   Patient was admitted to the unit without incident., he was calm and cooperative during the assessment. Patient was oriented to the unit without incident.

## 2024-01-21 NOTE — ED Notes (Signed)
 Pt discharged to [facility] in no acute distress. Belongings returned from locker #18 and given to [employer] driver. Safety maintained.
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
## 2024-01-21 NOTE — ED Provider Notes (Addendum)
 Behavioral Health Urgent Care Medical Screening Exam  Patient Name: Tyrone White MRN: [id number] Date of Evaluation: 01/21/24 Chief Complaint:  I need to stop drinking Diagnosis:  Final diagnoses:  Alcohol-induced mood disorder (HCC)  Severe episode of recurrent major depressive disorder, without psychotic features (HCC)  Opioid type dependence, continuous (HCC)  Methamphetamine use disorder, severe, dependence (HCC)  GAD (generalized anxiety disorder)   I personally spent a total of 60 minutes in the care of the patient today including preparing to see the patient, getting/reviewing separately obtained history, performing a medically appropriate exam/evaluation, counseling and educating, placing orders, referring and communicating with other health care professionals, documenting clinical information in the EHR, independently interpreting results, communicating results, and coordinating care.  Patient meets level 5 criteria for coding due to patient requires an inpatient hospitalization to stabilize current acute mental health crisis.  History of Present illness: Tyrone White is a 38 y.o. male.   Tyrone White is a 38 year old male with a history Alcohol Use Disorder, Opioid Use disorder, Generalized Anxiety Disorder, Major Depressive Disorder,  presented to [facility] as a walk in, voluntary, accompanied by uncle, with complaints of alcohol and substance addiction with recurrent suicidal ideations.  Tyrone White, 38 y.o., male patient seen face to face by this provider, consulted with Dr. Cole; and chart reviewed on 01/21/24.     On evaluation Tyrone White reports that he was admitted to a residential treatment program for substance dependence approximately 1 year ago and discharged 7 months ago. Initially, he did fine at discharge as he had his medications and took them consistently until he ran about 6 months ago. He reports once off of his medication he begin  to initially relapse on alcohol and then began use of illicit substance percocet, cocaine, and methamphetamines. Patient reports living with his uncle who has been concerned about his overall mental health and excessive alcohol and substance use. Patient recently had his license taken and charged with DUI after being found in a vehicle under influence of alcohol. Patient reports despite current charges,he continues to drive without a license and drive intoxicated. Patient reports he was working at a [employer] facility and thinks his position has been terminated due to missing work today as a result of constantly being intoxicated.  Patient reports that he has been using at least 3-4 on most days when he is able to take them and to prevent withdrawal symptoms he has been buying Subutex off of the street.  He has last used cocaine within the last 24 hours.  He reports some form of substance use over the course of the last 6 months.  He endorses that he has had recurrent suicidal ideations without plan to end his life. Endorses feeling of worthlessness and hopelessness pertaining to his currently situation.  He denies homicidal ideations although endorses recurrent thoughts of robbing a bank to excess money but subsequently states he knows that would not be the rightway to go about doing things. 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 Patient endorses that he is willing to be voluntarily admitted for treatment of addiction and underlying mental health illness. Patients demonstrates impulsivity, risk taking behaviors, and exhibits some manic behaviors, however this is in the context of active alcohol  and substance use. Patient should be evaluated on  an on-going basis for mood disorder after clinically stable. Patient meets inpatient psychiatric criteria for inpatient psychiatric treatment.  During evaluation Tyrone White is initially standing pacing around the room and however is cooperative when asked to sit down. Patient is alert, oriented, x 4. Patient is in no acute distress.  Patient is cooperative, although appears hyperactive, anxious, cooperative, with congruent affect calm/cooperative; and mood congruent with affect. He is speaking with pressured speech in a moderate tone. Patient makes fair eye contact.  His thought process is circumstantial although at times makes relevant statements.  Objectively, there is no indication that he is currently responding to internal/external stimuli. Patient doesn't appear to be delusional. Patient endorses passive SI without a plan and denies homicidal ideations. Denies symptoms or behaviors associated with paranoia or active psychosis. Patient has remained calm throughout assessment. Flowsheet Row ED from 01/21/2024 in [facility] ED from 11/21/2023 in [facility] Clinical Support from 11/25/2020 in [facility]  C-SSRS RISK CATEGORY Moderate Risk No Risk No Risk    Psychiatric Specialty Exam  Presentation  General Appearance:Fairly Groomed  Eye Contact:Fair  Speech:Pressured  Speech Volume:Increased  Handedness:Right   Mood and Affect  Mood: Anxious; Dysphoric  Affect: Blunt   Thought Process  Thought Processes: Linear  Descriptions of Associations:Circumstantial  Orientation:Full (Time, Place and Person)  Thought Content:Logical  Diagnosis of Schizophrenia or Schizoaffective disorder in past: No   Hallucinations:None  Ideas of Reference:None  Suicidal Thoughts:Yes, Passive Without Intent  Homicidal Thoughts:-- (Thoughts of robbing a bank, not actively killing anyone as a means to  obtain money)   Sensorium  Memory: Immediate Fair; Recent Fair; Remote Fair  Judgment: Poor  Insight: Poor   Executive Functions  Concentration: Poor  Attention Span: Poor  Recall: [employer] of Knowledge: Fair  Language: Fair   Psychomotor Activity  Psychomotor Activity: Restlessness   Assets  Assets: Resilience; Social Support; Physical Health; Desire for Improvement; Communication Skills   Sleep  Sleep: Poor (drinks until he blacks out)  Number of hours:  -- (Unable to quantify numbers of hours of sleep)   Physical Exam: Physical Exam Constitutional:      General: He is not in acute distress.    Appearance: Normal appearance. He is not ill-appearing.  HENT:     Head: Normocephalic and atraumatic.     Nose: Nose normal.   Eyes:     Extraocular Movements: Extraocular movements intact.     Conjunctiva/sclera: Conjunctivae normal.     Pupils: Pupils are equal, round, and reactive to light.    Cardiovascular:     Rate and Rhythm: Normal rate and regular rhythm.  Pulmonary:     Effort: Pulmonary effort is normal.     Breath sounds: Normal breath sounds.   Musculoskeletal:     Cervical back: Normal range of motion and neck supple.   Skin:    General: Skin is warm and dry.   Neurological:     General: No focal deficit present.     Mental Status: He is alert and oriented to person, place, and time.     Review of Systems  Psychiatric/Behavioral:  Positive for depression, substance abuse and suicidal ideas. The patient is nervous/anxious.    Blood pressure 118/79, pulse 74, temperature 98.9 F (37.2 C), temperature source Oral, resp. rate 20, SpO2 99%. There is no height or weight on file to calculate BMI.  Musculoskeletal: Strength & Muscle Tone: within normal limits Gait & Station: normal Patient leans: N/A   [facility] MSE Discharge Disposition for Follow up and Recommendations: Based on my evaluation I certify that psychiatric  inpatient services furnished can reasonably be expected to improve the patient's condition which I recommend transfer to an appropriate accepting facility.   Patient meets criteria for inpatient psychiatric hospital admission and has been accepted to [facility], acceptance 01/21/24   Restarted home medications, held Remeron  due to change in dose/frequency of Seroquel . Inpatient provider can considering resuming if needed. Restarted Buspar  10 mg BID, increase to TID once withdrawal symptoms resolve. CIWA and COWS protocol given patient has an opioid addictions and AUD    Suzen Lesches, NP 01/21/2024, 1:18 PM

## 2024-01-21 NOTE — Progress Notes (Signed)
 Pt has been accepted to [facility] on 01/21/2024 Bed assignment: 300-2  Pt meets inpatient criteria per: Suzen Lesches NP  Attending Physician will be: Dr. Prentis    Report can be called to: Adult unit: [phone]  Pt can arrive after pending labs, vol, UDS, EKG.   Care Team Notified: [facility]  Danika Carlo RN, Suzen Lesches NP   [place] Zaquan Duffner LCSW-A   01/21/2024 9:52 AM

## 2024-01-21 NOTE — Progress Notes (Signed)
 Pt is admitted to [facility] due to substance abuse. Pt is alert and oriented X4. Pt is ambulatory and is oriented to staff/unit. Pt was cooperative with labs and skin assessment. Scratches were noted on pt's torso and bilateral shin. Pt denies current SI, plan or intent and verbally contracts for safety on the unit. Pt was advised to notify staff when having intrusive thoughts of hurting self or others. Pt verbalized understanding. Pt complained of back pain. PRN Naproxen  and scheduled meds administered per order. Pt denies current HI/AVH. Staff will monitor for pt's safety.

## 2024-01-21 NOTE — Progress Notes (Signed)
   01/21/24 2300  Psych Admission Type (Psych Patients Only)  Admission Status Voluntary  Psychosocial Assessment  Patient Complaints Anxiety;Sleep disturbance  Eye Contact Fair  Facial Expression Anxious  Affect Anxious;Appropriate to circumstance  Speech Logical/coherent  Interaction Assertive  Motor Activity Slow  Appearance/Hygiene Unremarkable  Behavior Characteristics Cooperative;Appropriate to situation  Mood Anxious;Pleasant  Thought Process  Coherency WDL  Content Preoccupation  Delusions Paranoid  Perception WDL  Hallucination None reported or observed  Judgment Impaired  Confusion WDL  Danger to Self  Current suicidal ideation? Denies  Agreement Not to Harm Self Yes  Description of Agreement verbal  Danger to Others  Danger to Others None reported or observed
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
## 2024-01-21 NOTE — Discharge Instructions (Addendum)
Accepted to [facility]

## 2024-01-22 DIAGNOSIS — F1094 Alcohol use, unspecified with alcohol-induced mood disorder: Secondary | ICD-10-CM | POA: Diagnosis not present

## 2024-01-22 DIAGNOSIS — F39 Unspecified mood [affective] disorder: Principal | ICD-10-CM

## 2024-01-22 MED ORDER — QUETIAPINE FUMARATE 50 MG PO TABS
150.0000 mg | ORAL_TABLET | Freq: Every day | ORAL | Status: DC
  Administered 2024-01-22 – 2024-01-25 (×4): 150 mg via ORAL
  Filled 2024-01-22 (×4): qty 1

## 2024-01-22 MED ORDER — NICOTINE POLACRILEX 2 MG MT GUM
2.0000 mg | CHEWING_GUM | OROMUCOSAL | Status: DC | PRN
  Administered 2024-01-22 – 2024-01-27 (×23): 2 mg via ORAL
  Filled 2024-01-22 (×15): qty 1

## 2024-01-22 NOTE — H&P (Signed)
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 psychiatric evaluation & treatments.    Alcohol-induced mood disorder (HCC)  Plans: Medications: -- Continue Seroquel  tablets 50 mg p.o. daily for mood stabilization --Continue Seroquel  tablets 250 mg p.o. at bedtime for mood stabilization --Continue naltrexone  tablet 25 mg p.o. daily x 7 doses on my for opioid and alcohol withdrawal --Continue BuSpar  tablets 10 mg p.o. twice daily x 13 doses only  Clonidine detox protocol: See MAR  Ativan  detox protocol: See MAR  Other PRN  Medications  -Acetaminophen  650 mg every 6 as needed/mild pain  -Maalox 30 mL oral every 4 as needed/digestion  -Magnesium  hydroxide 30 mL daily as needed/mild constipation   --The risks/benefits/side-effects/alternatives to this  medication were discussed in detail with the patient and time was given for questions. The patient consents to medication trial.   -- Metabolic profile and EKG monitoring obtained while on an atypical antipsychotic (BMI: Lipid Panel: HbgA1c: QTc:)   -- Encouraged patient to participate in unit milieu and in scheduled group therapies   Continue BH Agitation Protocol  --Haldol 5 mg, oral, 3 times daily as needed, mild agitation  --Benadryl  50 mg, oral, 3 times daily as needed, mild agitation                                      OR   --Haldol injection 5 mg, IM, 3 times daily as needed, moderate agitation  --Benadryl  injection 50 mg, IM, 3 times daily as needed, moderate agitation  --Ativan  injection 2 mg, IM, 3 times daily as needed, moderate agitation                                      OR  --Haldol injection 10 mg, IM, 3 times daily as needed, severe agitation  --Benadryl  injection 50 mg, IM, 3 times daily as needed, severe agitation  --Ativan  injection 2 mg, IM, 3 times daily as needed, severe agitation   Admission labs reviewed: CMP:BUN < 5,AST 54, ALT 45, Lipid Level: WNL. CBC with Diff: WNL. HgBA1C: 5.8.  TSH: 0.376. BAL:37. UDS: positive for Buprenorphine, Methamphetamine, Oxycodone, Marijuana  New labs ordered: None  EKG reviewed: NSR with sinus Arrhythmia, ventricular rate, QT/Qtc 392/394  Safety and Monitoring:  Voluntary admission to inpatient psychiatric unit for safety, stabilization and treatment  Daily contact with patient to assess and evaluate symptoms and progress in treatment  Patient's case to be discussed in multi-disciplinary team meeting  Observation Level : q15 minute checks  Vital signs: q12 hours  Precautions: suicide, but pt  currently verbally contracts for safety on unit?   Discharge Planning:  Social work and case management to assist with discharge planning and identification of hospital follow-up needs prior to discharge  Estimated LOS: 5-7?days  Discharge Concerns: Need to establish a safety plan; Medication compliance and effectiveness  Discharge Goals: Return home with outpatient referrals for mental health follow-up including medication management/psychotherapy.   Long Term Goal(s): Improvement in symptoms so as ready for discharge  Short Term Goals: Ability to identify changes in lifestyle to reduce recurrence of condition will improve, Ability to verbalize feelings will improve, Ability to disclose and discuss suicidal ideas, Ability to demonstrate self-control will improve, Ability to identify and develop effective coping behaviors will improve, Ability to maintain clinical measurements within normal limits will improve, Compliance with prescribed medications will improve, and Ability to identify triggers associated with substance abuse/mental health issues will improve  Physician Treatment Plan for Secondary Diagnosis: Principal Problem:   Alcohol-induced mood disorder (HCC)  I certify that inpatient services furnished can reasonably be expected to improve the patient's condition.    Ellouise JAYSON Azure, FNP 7/1/20251:06 PM

## 2024-01-22 NOTE — Plan of Care (Signed)
   Problem: Education: Goal: Emotional status will improve Outcome: Progressing Goal: Mental status will improve Outcome: Progressing Goal: Verbalization of understanding the information provided will improve Outcome: Progressing

## 2024-01-22 NOTE — Group Note (Signed)
 Recreation Therapy Group Note   Group Topic:Animal Assisted Therapy   Group Date: 01/22/2024 Start Time: 0946 End Time: 1029 Facilitators: Kalani Baray-McCall, LRT,CTRS Location: [address] Dayroom   Animal-Assisted Activity (AAA) Program Checklist/Progress Notes Patient Eligibility Criteria Checklist & Daily Group note for Rec Tx Intervention  AAA/T Program Assumption of Risk Form signed by Patient/ or Parent Legal Guardian Yes  Patient is free of allergies or severe asthma Yes  Patient reports no fear of animals Yes  Patient reports no history of cruelty to animals Yes  Patient understands his/her participation is voluntary Yes  Patient washes hands before animal contact Yes  Patient washes hands after animal contact Yes  Behavioral Response: Attentive   Education: Hand Washing, Appropriate Animal Interaction   Education Outcome: Acknowledges education.    Affect/Mood: Appropriate   Participation Level: Moderate   Participation Quality: Independent   Behavior: Appropriate   Speech/Thought Process: Focused   Insight: Moderate   Judgement: Moderate   Modes of Intervention: Teaching laboratory technician   Patient Response to Interventions:  Attentive   Education Outcome:  In group clarification offered    Clinical Observations/Individualized Feedback: Pt was quiet but would verbally engage at times. Pt also asked a few questions and interacted with Dixie.     Plan: Continue to engage patient in RT group sessions 2-3x/week.   Titania Gault-McCall, LRT,CTRS 01/22/2024 1:04 PM

## 2024-01-22 NOTE — Progress Notes (Signed)
   01/22/24 0653  15 Minute Checks  Location Cafeteria  Visual Appearance Calm  Behavior Composed  Sleep (Behavioral Health Patients Only)  Calculate sleep? (Click Yes once per 24 hr at 0600 safety check) Yes  Documented sleep last 24 hours 7

## 2024-01-22 NOTE — [facility]HH Suicide Risk Assessment (Signed)
 Suicide Risk Assessment  Admission Assessment    [facility] Admission Suicide Risk Assessment   Nursing information obtained from:  Patient Demographic factors:  Male Current Mental Status:  Suicidal ideation indicated by patient Loss Factors:  Financial problems / change in socioeconomic status Historical Factors:  Prior suicide attempts Risk Reduction Factors:  Religious beliefs about death  Total Time spent with patient: 45 minutes  Principal Problem: Alcohol-induced mood disorder (HCC) Diagnosis:  Principal Problem:   Alcohol-induced mood disorder (HCC)  Subjective Data: Tyrone White is a 38 year old African-American male with prior psychiatric history significant for Alcohol Use Disorder, Opioid Use disorder, Generalized Anxiety Disorder, Major Depressive Disorder, who presents voluntarily to [facility] from [facility] with complaints of alcohol and substance addiction with recurrent suicidal ideations.   Continued Clinical Symptoms:  Alcohol Use Disorder Identification Test Final Score (AUDIT): 32 The Alcohol Use Disorders Identification Test, Guidelines for Use in Primary Care, Second Edition.  World Science writer [facility]). Score between 0-7:  no or low risk or alcohol related problems. Score between 8-15:  moderate risk of alcohol related problems. Score between 16-19:  high risk of alcohol related problems. Score 20 or above:  warrants further diagnostic evaluation for alcohol dependence and treatment.  CLINICAL FACTORS:   Depression:   Anhedonia Hopelessness Severe Alcohol/Substance Abuse/Dependencies More than one psychiatric diagnosis Previous Psychiatric Diagnoses and Treatments Medical Diagnoses and Treatments/Surgeries : He will be here tomorrow Musculoskeletal: Strength & Muscle Tone: within normal limits Gait & Station: normal Patient leans: N/A  Psychiatric Specialty Exam:  Presentation  General Appearance:  Casual; Fairly  Groomed  Eye Contact: Good  Speech: Clear and Coherent; Normal Rate  Speech Volume: Normal  Handedness: Right  Mood and Affect  Mood: Anxious; Depressed; Irritable  Affect: Blunt; Congruent  Thought Process  Thought Processes: Coherent; Linear  Descriptions of Associations:Intact  Orientation:Full (Time, Place and Person)  Thought Content:Logical  History of Schizophrenia/Schizoaffective disorder:No  Duration of Psychotic Symptoms:No data recorded Hallucinations:Hallucinations: None  Ideas of Reference:Paranoia (Substance induced paranoia)  Suicidal Thoughts:Suicidal Thoughts: No SI Passive Intent and/or Plan: -- (Denies)  Homicidal Thoughts:Homicidal Thoughts: No  Sensorium  Memory: Immediate Fair; Recent Fair  Judgment: Poor  Insight: Shallow  Executive Functions  Concentration: Fair  Attention Span: Fair  Recall: [employer] of Knowledge: Fair  Language: Fair  Psychomotor Activity  Psychomotor Activity: Psychomotor Activity: Normal  Assets  Assets: Physical Health  Sleep  Sleep: Sleep: Good Number of Hours of Sleep: 7  Physical Exam: Physical Exam Vitals and nursing note reviewed.  Constitutional:      Appearance: He is normal weight.  HENT:     Head: Normocephalic.     Right Ear: External ear normal.     Left Ear: External ear normal.     Nose: Nose normal.     Mouth/Throat:     Mouth: Mucous membranes are moist.     Pharynx: Oropharynx is clear.   Eyes:     Extraocular Movements: Extraocular movements intact.    Cardiovascular:     Rate and Rhythm: Normal rate.     Pulses: Normal pulses.  Pulmonary:     Effort: Pulmonary effort is normal.  Abdominal:     Comments: Deferred   Genitourinary:    Comments: Deferred   Musculoskeletal:        General: Normal range of motion.     Cervical back: Normal range of motion.   Skin:    General: Skin  is warm.   Neurological:     General: No focal deficit  present.     Mental Status: He is alert and oriented to person, place, and time.   Psychiatric:        Mood and Affect: Mood normal.        Behavior: Behavior normal.        Thought Content: Thought content normal.    Review of Systems  Constitutional:  Negative for chills and fever.  HENT:  Negative for sore throat.   Eyes:  Negative for blurred vision.  Respiratory:  Negative for cough, sputum production, shortness of breath and wheezing.   Cardiovascular:  Negative for chest pain and palpitations.  Gastrointestinal:  Negative for heartburn and nausea.  Genitourinary:  Negative for dysuria, frequency and urgency.  Musculoskeletal:  Negative for falls.  Skin:  Negative for itching and rash.  Neurological:  Negative for dizziness, tremors, seizures, loss of consciousness and headaches.  Endo/Heme/Allergies:        See allergy listing   Psychiatric/Behavioral:  Positive for depression and substance abuse. Negative for hallucinations and suicidal ideas. The patient is nervous/anxious. The patient does not have insomnia.    Blood pressure 112/89, pulse 78, temperature 97.6 F (36.4 C), temperature source Oral, resp. rate 18, height 6' 2 (1.88 m), weight 76.2 kg, SpO2 100%. Body mass index is 21.57 kg/m.  COGNITIVE FEATURES THAT CONTRIBUTE TO RISK:  Polarized thinking    SUICIDE RISK:   Severe:  Frequent, intense, and enduring suicidal ideation, specific plan, no subjective intent, but some objective markers of intent (i.e., choice of lethal method), the method is accessible, some limited preparatory behavior, evidence of impaired self-control, severe dysphoria/symptomatology, multiple risk factors present, and few if any protective factors, particularly a lack of social support.  PLAN OF CARE: Treatment Plan Summary: Daily contact with patient to assess and evaluate symptoms and progress in treatment and Medication management  Physician Treatment Plan for Primary  Diagnosis: Assessment: Tyrone White is a 38 year old African-American male with prior psychiatric history significant for Alcohol Use Disorder, Opioid Use disorder, Generalized Anxiety Disorder, Major Depressive Disorder, who presents voluntarily to [facility] from [facility] with complaints of alcohol and substance addiction with recurrent suicidal ideations.    Alcohol-induced mood disorder (HCC)  Plans: Medications: -- Continue Seroquel  tablets 50 mg p.o. daily for mood stabilization --Continue Seroquel  tablets 150 mg p.o. at bedtime for mood stabilization --Continue naltrexone  tablet 25 mg p.o. daily x 7 doses on my for opioid and alcohol withdrawal --Continue BuSpar  tablets 10 mg p.o. twice daily x 13 doses only  Clonidine detox protocol: See MAR  Ativan  detox protocol: See MAR  Other PRN Medications  -Acetaminophen  650 mg every 6 as needed/mild pain  -Maalox 30 mL oral every 4 as needed/digestion  -Magnesium  hydroxide 30 mL daily as needed/mild constipation   --The risks/benefits/side-effects/alternatives to this medication were discussed in detail with the patient and time was given for questions. The patient consents to medication trial.   -- Metabolic profile and EKG monitoring obtained while on an atypical antipsychotic (BMI: Lipid Panel: HbgA1c: QTc:)   -- Encouraged patient to participate in unit milieu and in scheduled group therapies   Continue BH Agitation Protocol  --Haldol 5 mg, oral, 3 times daily as needed, mild agitation  --Benadryl  50 mg, oral, 3 times daily as needed, mild agitation  OR   --Haldol injection 5 mg, IM, 3 times daily as needed, moderate agitation  --Benadryl  injection 50 mg, IM, 3 times daily as needed, moderate agitation  --Ativan  injection 2 mg, IM, 3 times daily as needed, moderate agitation                                      OR  --Haldol injection 10 mg, IM, 3 times daily as  needed, severe agitation  --Benadryl  injection 50 mg, IM, 3 times daily as needed, severe agitation  --Ativan  injection 2 mg, IM, 3 times daily as needed, severe agitation   Admission labs reviewed: CMP:BUN < 5,AST 54, ALT 45, Lipid Level: WNL. CBC with Diff: WNL. HgBA1C: 5.8.  TSH: 0.376. BAL:37. UDS: positive for Buprenorphine, Methamphetamine, Oxycodone, Marijuana  New labs ordered: None  EKG reviewed: NSR with sinus Arrhythmia, ventricular rate, QT/Qtc 392/394  Safety and Monitoring:  Voluntary admission to inpatient psychiatric unit for safety, stabilization and treatment  Daily contact with patient to assess and evaluate symptoms and progress in treatment  Patient's case to be discussed in multi-disciplinary team meeting  Observation Level : q15 minute checks  Vital signs: q12 hours  Precautions: suicide, but pt currently verbally contracts for safety on unit?   Discharge Planning:  Social work and case management to assist with discharge planning and identification of hospital follow-up needs prior to discharge  Estimated LOS: 5-7?days  Discharge Concerns: Need to establish a safety plan; Medication compliance and effectiveness  Discharge Goals: Return home with outpatient referrals for mental health follow-up including medication management/psychotherapy.   Long Term Goal(s): Improvement in symptoms so as ready for discharge  Short Term Goals: Ability to identify changes in lifestyle to reduce recurrence of condition will improve, Ability to verbalize feelings will improve, Ability to disclose and discuss suicidal ideas, Ability to demonstrate self-control will improve, Ability to identify and develop effective coping behaviors will improve, Ability to maintain clinical measurements within normal limits will improve, Compliance with prescribed medications will improve, and Ability to identify triggers associated with substance abuse/mental health issues will improve  Physician  Treatment Plan for Secondary Diagnosis: Principal Problem:    Alcohol-induced mood disorder (HCC)  I certify that inpatient services furnished can reasonably be expected to improve the patient's condition.   Ellouise JAYSON Azure, FNP 01/22/2024, 11:46 AM

## 2024-01-22 NOTE — Group Note (Signed)
 Date:  01/22/2024 Time:  11:05 AM  Group Topic/Focus:  Goals Group:   The focus of this group is to help patients establish daily goals to achieve during treatment and discuss how the patient can incorporate goal setting into their daily lives to aide in recovery. Orientation:   The focus of this group is to educate the patient on the purpose and policies of crisis stabilization and provide a format to answer questions about their admission.  The group details unit policies and expectations of patients while admitted.    Participation Level:  Did Not Attend   Tyrone White [name] 01/22/2024, 11:05 AM

## 2024-01-22 NOTE — Progress Notes (Signed)
   01/22/24 0834  Psych Admission Type (Psych Patients Only)  Admission Status Voluntary  Psychosocial Assessment  Patient Complaints Anxiety;Depression  Eye Contact Fair  Facial Expression Anxious  Affect Appropriate to circumstance  Speech Logical/coherent  Interaction Assertive  Motor Activity Slow  Appearance/Hygiene Unremarkable  Behavior Characteristics Cooperative  Mood Anxious  Thought Process  Coherency WDL  Content WDL  Delusions None reported or observed  Perception WDL  Hallucination None reported or observed  Judgment Poor  Confusion None  Danger to Self  Current suicidal ideation? Denies  Agreement Not to Harm Self Yes  Description of Agreement verbal  Danger to Others  Danger to Others None reported or observed

## 2024-01-22 NOTE — Group Note (Signed)
 LCSW Group Therapy Note   Group Date: 01/22/2024 Start Time: 1100 End Time: 1200   Participation:  patient was present and actively participated in the conversation.  Type of Therapy:  Group Therapy  Topic:  Stronger Together:  Building Healthy Relationships  Objective:  To explore loneliness, boundaries, and safe ways to build relationships.  Goals: Recognize healthy vs. unhealthy relationships. Learn safe ways to connect with others. Strengthen communication and [employer].  Summary:  Participants discussed loneliness, healthy connections, and setting boundaries. They explored safe ways to meet people and shared personal experiences. Key insights were reinforced through discussion and quotes.  Therapeutic Modalities Used: Cognitive Behavioral Therapy (CBT) Elements - Identifying unhealthy relationship patterns, challenging negative thoughts about connection. Dialectical Behavior Therapy (DBT) Elements - Interpersonal effectiveness, setting and maintaining boundaries. Supportive Group Therapy - Peer discussion, shared experiences, and emotional validation.   Escher Harr O Srinidhi Landers, LCSWA 01/22/2024  5:47 PM

## 2024-01-22 NOTE — Progress Notes (Signed)
   01/22/24 2200  Psych Admission Type (Psych Patients Only)  Admission Status Voluntary  Psychosocial Assessment  Patient Complaints Depression  Eye Contact Fair  Facial Expression Flat  Affect Appropriate to circumstance  Speech Logical/coherent  Interaction Assertive  Motor Activity Slow  Appearance/Hygiene Unremarkable  Behavior Characteristics Cooperative  Mood Pleasant  Thought Process  Coherency WDL  Content WDL  Delusions None reported or observed  Perception WDL  Hallucination None reported or observed  Judgment Poor  Confusion None  Danger to Self  Current suicidal ideation? Denies  Self-Injurious Behavior No self-injurious ideation or behavior indicators observed or expressed   Agreement Not to Harm Self Yes  Description of Agreement Verbal  Danger to Others  Danger to Others None reported or observed

## 2024-01-22 NOTE — Progress Notes (Incomplete)
 38 year old male with extensive history of substance use disorder.  Voluntary presentation seeking detox.  Intoxicated with multiple psychoactive substances.  Expressed thoughts of suicide by jumping from a height.  No overt psychotic features.  No overt manic features. We will initiate alcohol withdrawal protocol and use quetiapine  at bedtime only.  We will get collateral and evaluate him further.

## 2024-01-23 ENCOUNTER — Encounter (HOSPITAL_COMMUNITY): Payer: Self-pay

## 2024-01-23 DIAGNOSIS — F1094 Alcohol use, unspecified with alcohol-induced mood disorder: Secondary | ICD-10-CM | POA: Diagnosis not present

## 2024-01-23 NOTE — Progress Notes (Signed)
   01/23/24 0900  Psych Admission Type (Psych Patients Only)  Admission Status Voluntary/72 hour document signed  Date 72 hour document signed  01/23/24  Time 72 hour document signed  0845  Provider Notified (First and Last Name) (see details for LINK to note) Oliva Salmon  Psychosocial Assessment  Patient Complaints Anxiety;Depression;Irritability  Eye Contact Fair  Facial Expression Anxious;Flat  Affect Anxious;Flat  Speech Logical/coherent  Interaction Assertive  Motor Activity Other (Comment) (WNL)  Appearance/Hygiene Unremarkable  Behavior Characteristics Cooperative;Anxious  Mood Irritable;Preoccupied  Thought Process  Coherency WDL  Content WDL  Delusions None reported or observed  Perception WDL  Hallucination Auditory ( I hear people talking shit.)  Judgment Impaired  Confusion None  Danger to Self  Current suicidal ideation? Passive;Verbalizes  Agreement Not to Harm Self Yes  Description of Agreement Verbal  Danger to Others  Danger to Others None reported or observed

## 2024-01-23 NOTE — Plan of Care (Signed)
   Problem: Education: Goal: Emotional status will improve Outcome: Progressing Goal: Mental status will improve Outcome: Progressing Goal: Verbalization of understanding the information provided will improve Outcome: Progressing   Problem: Activity: Goal: Interest or engagement in activities will improve Outcome: Progressing Goal: Sleeping patterns will improve Outcome: Progressing

## 2024-01-23 NOTE — B[facility] Group Notes (Addendum)
 Spirituality group facilitated by Elia Rockie Sofia, BCC.  Group Description: Group focused on topic of hope. Patients participated in facilitated discussion around topic, connecting with one another around experiences and definitions for hope. Group members engaged with visual explorer photos, reflecting on what hope looks like for them today. Group engaged in discussion around how their definitions of hope are present today in hospital.  Modalities: Psycho-social ed, Adlerian, Narrative, MI  Patient Progress:   Patient Progress: Eldor attended group. Though verbal participation was minimal, he demonstrated engagement in the conversation.

## 2024-01-23 NOTE — Group Note (Signed)
 Date:  01/23/2024 Time:  1:33 AM  Group Topic/Focus:  Wrap-Up Group:   The focus of this group is to help patients review their daily goal of treatment and discuss progress on daily workbooks.    Additional Comments:   Pt was encouraged, but opted out of attending wrap up group this evening.   Tyrone White 01/23/2024, 1:33 AM
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
## 2024-01-23 NOTE — Plan of Care (Signed)
  Problem: Activity: Goal: Interest or engagement in activities will improve Outcome: Progressing Goal: Sleeping patterns will improve Outcome: Progressing   Problem: Safety: Goal: Periods of time without injury will increase Outcome: Progressing   Problem: Physical Regulation: Goal: Ability to maintain clinical measurements within normal limits will improve Outcome: Progressing

## 2024-01-23 NOTE — Group Note (Signed)
 Recreation Therapy Group Note   Group Topic:Leisure Education  Group Date: 01/23/2024 Start Time: 0930 End Time: 1026 Facilitators: Ronika Kelson-McCall, LRT,CTRS Location: [address] Dayroom   Group Topic: Leisure Education  Goal Area(s) Addresses:  Patient will identify positive leisure and recreation activities.  Patient will identify one positive benefit of participation in leisure activities.   Behavioral Response: Engaged  Intervention: Group Game  Activity: Guess the Lyric. Patients were to spin the spinner and whatever category [employer], Pop, Hip Hop, R&B, Dance and Indie) the spinner landed on, the patient got a card from that deck. LRT read the lyric and the patient got 3 chances to guess the missing lyric. If they are unable to guess the lyric, the other players got the chance to guess the answer. If the spinner landed on the black triangle, the could steal a card from another player. The person with the most cards was the winner.  Education:  Teacher, English as a foreign language, Special educational needs teacher, Building control surveyor  Education Outcome: Acknowledges education/In group clarification offered/Needs additional education   Affect/Mood: Appropriate   Participation Level: Engaged   Participation Quality: Independent   Behavior: Appropriate   Speech/Thought Process: Focused   Insight: Good   Judgement: Good   Modes of Intervention: Competitive Play   Patient Response to Interventions:  Engaged   Education Outcome:  In group clarification offered    Clinical Observations/Individualized Feedback: Pt came late to group. Pt was engaged and attentive during group session. Pt was called out of group but later returned. Pt was focused and on task.    Plan: Continue to engage patient in RT group sessions 2-3x/week.   Mitchelle Goerner-McCall, LRT,CTRS 01/23/2024 12:52 PM

## 2024-01-23 NOTE — BH IP Treatment Plan (Signed)
 Interdisciplinary Treatment and Diagnostic Plan Update  01/23/2024 Time of Session: 1037AM Tyrone White MRN: [id number]  Principal Diagnosis: Mood disorder [facility])  Secondary Diagnoses: Principal Problem:   Mood disorder (HCC) Active Problems:   Alcohol-induced mood disorder (HCC)   Current Medications:  Current Facility-Administered Medications  Medication Dose Route Frequency Provider Last Rate Last Admin   acetaminophen  (TYLENOL ) tablet 650 mg  650 mg Oral Q6H PRN Arloa Suzen RAMAN, NP   650 mg at 01/22/24 1807   alum & mag hydroxide-simeth (MAALOX/MYLANTA) 200-200-20 MG/5ML suspension 30 mL  30 mL Oral Q4H PRN Arloa Suzen RAMAN, NP       busPIRone  (BUSPAR ) tablet 10 mg  10 mg Oral BID Arloa Suzen RAMAN, NP   10 mg at 01/23/24 0711   cloNIDine (CATAPRES) tablet 0.1 mg  0.1 mg Oral Letha Arloa Suzen RAMAN, NP       Followed by   NOREEN ON 01/26/2024] cloNIDine (CATAPRES) tablet 0.1 mg  0.1 mg Oral QAC breakfast Arloa Suzen RAMAN, NP       dicyclomine (BENTYL) tablet 20 mg  20 mg Oral Q6H PRN Arloa Suzen RAMAN, NP       haloperidol (HALDOL) tablet 5 mg  5 mg Oral TID PRN Arloa Suzen RAMAN, NP       And   diphenhydrAMINE  (BENADRYL ) capsule 50 mg  50 mg Oral TID PRN Arloa Suzen RAMAN, NP       haloperidol lactate (HALDOL) injection 5 mg  5 mg Intramuscular TID PRN Arloa Suzen RAMAN, NP       And   diphenhydrAMINE  (BENADRYL ) injection 50 mg  50 mg Intramuscular TID PRN Arloa Suzen RAMAN, NP       And   LORazepam  (ATIVAN ) injection 2 mg  2 mg Intramuscular TID PRN Arloa Suzen RAMAN, NP       haloperidol lactate (HALDOL) injection 10 mg  10 mg Intramuscular TID PRN Arloa Suzen RAMAN, NP       And   diphenhydrAMINE  (BENADRYL ) injection 50 mg  50 mg Intramuscular TID PRN Arloa Suzen RAMAN, NP       And   LORazepam  (ATIVAN ) injection 2 mg  2 mg Intramuscular TID PRN Arloa Suzen RAMAN, NP       hydrOXYzine  (ATARAX ) tablet 25 mg  25 mg Oral Q6H PRN Arloa Suzen RAMAN, NP    25 mg at 01/23/24 9251   hydrOXYzine  (ATARAX ) tablet 25 mg  25 mg Oral Q6H PRN Arloa Suzen RAMAN, NP   25 mg at 01/22/24 1832   loperamide (IMODIUM) capsule 2-4 mg  2-4 mg Oral PRN Arloa Suzen RAMAN, NP       LORazepam  (ATIVAN ) tablet 1 mg  1 mg Oral Q6H PRN Arloa Suzen RAMAN, NP       LORazepam  (ATIVAN ) tablet 1 mg  1 mg Oral BID Arloa Suzen RAMAN, NP       Followed by   NOREEN ON 01/25/2024] LORazepam  (ATIVAN ) tablet 1 mg  1 mg Oral Daily Arloa Suzen RAMAN, NP       magnesium  hydroxide (MILK OF MAGNESIA) suspension 30 mL  30 mL Oral Daily PRN Arloa Suzen RAMAN, NP       methocarbamol (ROBAXIN) tablet 500 mg  500 mg Oral Q8H PRN Arloa Suzen RAMAN, NP   500 mg at 01/22/24 2136   multivitamin with minerals tablet 1 tablet  1 tablet Oral Daily Arloa Suzen RAMAN, NP   1 tablet at 01/23/24 0711   [START ON 01/25/2024] naltrexone  (  DEPADE) tablet 25 mg  25 mg Oral Daily Arloa Suzen RAMAN, NP       naproxen  (NAPROSYN ) tablet 500 mg  500 mg Oral BID PRN Arloa Suzen RAMAN, NP       nicotine  (NICODERM CQ  - dosed in mg/24 hours) patch 21 mg  21 mg Transdermal Q0600 Arloa Suzen RAMAN, NP       nicotine  polacrilex (NICORETTE) gum 2 mg  2 mg Oral PRN Prentis Kitchens A, DO   2 mg at 01/23/24 0955   ondansetron (ZOFRAN-ODT) disintegrating tablet 4 mg  4 mg Oral Q6H PRN Arloa Suzen RAMAN, NP       QUEtiapine  (SEROQUEL ) tablet 150 mg  150 mg Oral QHS Ntuen, Tina C, FNP   150 mg at 01/22/24 2136   QUEtiapine  (SEROQUEL ) tablet 50 mg  50 mg Oral Daily Arloa Suzen RAMAN, NP   50 mg at 01/23/24 9288   thiamine  (Vitamin B-1) tablet 100 mg  100 mg Oral Daily Arloa Suzen RAMAN, NP   100 mg at 01/23/24 9288   traZODone  (DESYREL ) tablet 50 mg  50 mg Oral QHS PRN Arloa Suzen RAMAN, NP       PTA Medications: Medications Prior to Admission  Medication Sig Dispense Refill Last Dose/Taking   busPIRone  (BUSPAR ) 10 MG tablet Take 1 tablet (10 mg total) by mouth 3 (three) times daily. (Patient not taking: Reported on  01/21/2024) 90 tablet 2    mirtazapine  (REMERON ) 30 MG tablet TAKE 1 TABLET (30 MG TOTAL) BY MOUTH AT BEDTIME. (Patient not taking: Reported on 01/21/2024) 30 tablet 2    naltrexone  (DEPADE) 50 MG tablet Take 1 tablet (50 mg total) by mouth daily. (Patient not taking: Reported on 01/21/2024) 30 tablet 2    QUEtiapine  (SEROQUEL ) 300 MG tablet Take 1 tablet (300 mg total) by mouth at bedtime. (Patient not taking: Reported on 01/21/2024) 30 tablet 2     Patient Stressors:    Patient Strengths:    Treatment Modalities: Medication Management, Group therapy, Case management,  1 to 1 session with clinician, Psychoeducation, Recreational therapy.   Physician Treatment Plan for Primary Diagnosis: Mood disorder (HCC) Long Term Goal(s): Improvement in symptoms so as ready for discharge   Short Term Goals: Ability to identify changes in lifestyle to reduce recurrence of condition will improve Ability to verbalize feelings will improve Ability to disclose and discuss suicidal ideas Ability to demonstrate self-control will improve Ability to identify and develop effective coping behaviors will improve Ability to maintain clinical measurements within normal limits will improve Compliance with prescribed medications will improve Ability to identify triggers associated with substance abuse/mental health issues will improve  Medication Management: Evaluate patient's response, side effects, and tolerance of medication regimen.  Therapeutic Interventions: 1 to 1 sessions, Unit Group sessions and Medication administration.  Evaluation of Outcomes: Not Progressing  Physician Treatment Plan for Secondary Diagnosis: Principal Problem:   Mood disorder (HCC) Active Problems:   Alcohol-induced mood disorder (HCC)  Long Term Goal(s): Improvement in symptoms so as ready for discharge   Short Term Goals: Ability to identify changes in lifestyle to reduce recurrence of condition will improve Ability to  verbalize feelings will improve Ability to disclose and discuss suicidal ideas Ability to demonstrate self-control will improve Ability to identify and develop effective coping behaviors will improve Ability to maintain clinical measurements within normal limits will improve Compliance with prescribed medications will improve Ability to identify triggers associated with substance abuse/mental health issues will improve  Medication Management: Evaluate patient's response, side effects, and tolerance of medication regimen.  Therapeutic Interventions: 1 to 1 sessions, Unit Group sessions and Medication administration.  Evaluation of Outcomes: Not Progressing   RN Treatment Plan for Primary Diagnosis: Mood disorder (HCC) Long Term Goal(s): Knowledge of disease and therapeutic regimen to maintain health will improve  Short Term Goals: Ability to remain free from injury will improve, Ability to verbalize frustration and anger appropriately will improve, Ability to demonstrate self-control, Ability to participate in decision making will improve, Ability to verbalize feelings will improve, Ability to disclose and discuss suicidal ideas, Ability to identify and develop effective coping behaviors will improve, and Compliance with prescribed medications will improve  Medication Management: RN will administer medications as ordered by provider, will assess and evaluate patient's response and provide education to patient for prescribed medication. RN will report any adverse and/or side effects to prescribing provider.  Therapeutic Interventions: 1 on 1 counseling sessions, Psychoeducation, Medication administration, Evaluate responses to treatment, Monitor vital signs and CBGs as ordered, Perform/monitor CIWA, COWS, AIMS and Fall Risk screenings as ordered, Perform wound care treatments as ordered.  Evaluation of Outcomes: Not Progressing   LCSW Treatment Plan for Primary Diagnosis: Mood disorder  (HCC) Long Term Goal(s): Safe transition to appropriate next level of care at discharge, Engage patient in therapeutic group addressing interpersonal concerns.  Short Term Goals: Engage patient in aftercare planning with referrals and resources, Increase social support, Increase ability to appropriately verbalize feelings, Increase emotional regulation, Facilitate acceptance of mental health diagnosis and concerns, Facilitate patient progression through stages of change regarding substance use diagnoses and concerns, Identify triggers associated with mental health/substance abuse issues, and Increase skills for wellness and recovery  Therapeutic Interventions: Assess for all discharge needs, 1 to 1 time with Social worker, Explore available resources and support systems, Assess for adequacy in community support network, Educate family and significant other(s) on suicide prevention, Complete Psychosocial Assessment, Interpersonal group therapy.  Evaluation of Outcomes: Not Progressing   Progress in Treatment: Attending groups: Yes. Participating in groups: Yes. Taking medication as prescribed: Yes. Toleration medication: Yes. Family/Significant other contact made: No, will contact:  Tyrone White [phone] Patient understands diagnosis: Yes. Discussing patient identified problems/goals with staff: Yes. Medical problems stabilized or resolved: Yes. Denies suicidal/homicidal ideation: Yes. Issues/concerns per patient self-inventory: No.  New problem(s) identified: No, Describe:  none  New Short Term/Long Term Goal(s): detox, medication management for mood stabilization; elimination of SI thoughts; development of comprehensive mental wellness/sobriety plan   Patient Goals:  Stop drinking alcohol so severely, find a sober living place or residential  Discharge Plan or Barriers: Patient recently admitted. CSW will continue to follow and assess for appropriate referrals and possible  discharge planning.    Reason for Continuation of Hospitalization: Medication stabilization Suicidal ideation Withdrawal symptoms  Estimated Length of Stay: 5-7 days  Last 3 [place] Suicide Severity Risk Score: Flowsheet Row [facility] Most recent reading at 01/21/2024  5:51 PM ED from 01/21/2024 in [facility] Most recent reading at 01/21/2024  9:46 AM ED from 11/21/2023 in [facility] Most recent reading at 11/21/2023  8:52 AM  C-SSRS RISK CATEGORY High Risk Moderate Risk No Risk    Last PHQ 2/9 Scores:    11/25/2020    3:05 PM 09/22/2020    9:20 AM 08/30/2020    3:50 PM  Depression screen PHQ 2/9  Decreased Interest 0 2  1  Down, Depressed, Hopeless 0 3 3  PHQ - 2 Score 0 5 4  Altered sleeping 2 2 3   Tired, decreased energy 1 2 2   Change in appetite 0 1 0  Feeling bad or failure about yourself  0 1 2  Trouble concentrating 0 3 3  Moving slowly or fidgety/restless 0 1 1  Suicidal thoughts 0 0 0  PHQ-9 Score 3 15 15   Difficult doing work/chores Not difficult at all Somewhat difficult     Scribe for Treatment Team: Jenkins LULLA Primer, ISRAEL 01/23/2024 12:26 PM

## 2024-01-23 NOTE — [facility]HH Suicide Risk Assessment (Addendum)
 [facility] INPATIENT:  Family/Significant Other Suicide Prevention Education  Suicide Prevention Education:  Contact Attempts: Aunt - Corean Purpura [phone]), (has been identified by the patient as the family member/significant other with whom the patient will be residing, and identified as the person(s) who will aid the patient in the event of a mental health crisis. We were unsuccessful in providing suicide prevention education. Additional attempts will be made prior to discharge. Suicide prevention education will also be completed with Pt and Suicide Prevention pamphlet will be provided prior to discharge.  Attempted to reach Aunt for purposes of safety planning and if appropriate provide email address to send obituary for Pt.  Unable to reach Aunt and unfortunately there was not an opportunity to leave a voicemail.   Date and time of first attempt:01/23/24 /1651 Date and time of second attempt: /  Tyrone LOISE [employer], LCSW 01/23/2024, 5:50 PM

## 2024-01-23 NOTE — Progress Notes (Signed)
   01/23/24 0556  15 Minute Checks  Location Bedroom  Visual Appearance Calm  Behavior Sleeping  Sleep (Behavioral Health Patients Only)  Calculate sleep? (Click Yes once per 24 hr at 0600 safety check) Yes  Documented sleep last 24 hours 9.5

## 2024-01-23 NOTE — Progress Notes (Incomplete)
 Patient is gradually detoxing from multiple psychoactive substances.  He acknowledged acting impulsively while on the influence.  His goal is to get sober and maintain sobriety in the community.  He is working on sober living community in [place] .  He acknowledged prior addiction treatment but no desire to get into one now.  Patient is not endorsing any psychotic symptoms.  He is not endorsing any violent thoughts towards self or others. We will maintain his current regimen.  Hopeful discharge by the end of the week.

## 2024-01-23 NOTE — Progress Notes (Signed)
 Patient ID: Tyrone White, male   DOB: July 03, 1986, 38 y.o.   MRN: [id number] 1030 CSW spoke with Pt on the unit and provide multiple residential treatment options to aid in treating his addiction. Resources provided were specifically chosen based on Pt using [employer] has primary payor source. Pt did share that he has Medicaid secondary and this was confirmed via chart review. Of the options given Pt expressed an interest in referral to [facility] located in [place], [place] as well as [facility] located in [place], [place]. Pt expressed legitimate concern about treatment being interrupted by the loss or lack of insurance benefits as his current [employer] plan is via his employer. He has no plans to return to this job and is unsure the when benefits would cease post employment. CSW encouraged Pt to reach out to his employer's HR provider and if necessary CSW can provide telephone number for his employer.   Pt did request that his Tyrone White [phone] be contacted and he is requesting an obituary be emailed to this CSW. Does not appear to pertinent to his stay but rather regarding him missing work the funeral service he admits not actually attending.   1430 Spoke with Pt on the unit and he requested CSW print an application for [employer] located in [place], [place]. Pt was provided application and he completed and returned to CSW.  1708 Application to [employer] was faxed and notification of successful fax tramission was receive at 1711.   CSW team will continue to follow for care coordination and discharge planning needs.     Tyrone Rossano [name], Tyrone White 01/23/24 5:48 PM

## 2024-01-23 NOTE — Group Note (Signed)
 Date:  01/23/2024 Time:  11:48 AM  Group Topic/Focus:  Goals Group:   The focus of this group is to help patients establish daily goals to achieve during treatment and discuss how the patient can incorporate goal setting into their daily lives to aide in recovery. Orientation:   The focus of this group is to educate the patient on the purpose and policies of crisis stabilization and provide a format to answer questions about their admission.  The group details unit policies and expectations of patients while admitted.    Participation Level:  Active  Participation Quality:  Appropriate  Affect:  Appropriate  Cognitive:  Alert, Appropriate, and Oriented  Insight: Appropriate  Engagement in Group:  Engaged  Modes of Intervention:  Discussion  Additional Comments:Pt goal is to stay positive and focused on his goal of leaving.   Leonilda Cozby A [facility] 01/23/2024, 11:48 AM

## 2024-01-23 NOTE — Progress Notes (Signed)
 [facility] MD Progress Note  01/23/2024 10:13 AM Tyrone White  MRN:  [id number]   Principal Problem: Mood disorder (HCC) Diagnosis: Principal Problem:   Mood disorder (HCC) Active Problems:   Alcohol-induced mood disorder (HCC)  Reason for admission:   Stepfon Tyrone White is a 38 year old African-American male with prior psychiatric history significant for Alcohol Use Disorder, Opioid Use disorder, Generalized Anxiety Disorder, Major Depressive Disorder, who presents voluntarily to [facility] from [facility] with complaints of alcohol and substance addiction with recurrent suicidal ideations.   24-hour chart review: Vital signs: BP 110/79, HR 64 As needed medications: Hydroxyzine  and Tylenol  required for anxiety and body aches Patient compliant with scheduled psychotropic medications  Today's assessment notes: On assessment today, the pt reports that his mood is irritable, 'cranky' and agitated.  Patient reports, I am still here, you guys are not doing anything for me and I am agitated and angry which is part of my withdrawal while from multiple drugs and alcohol.  This provider made patient aware that he is on COWS and CIWA protocol.  COWS score of 4, and CIWA score of 2 as noted in the nursing flowsheet.  Patient added, these are is not doing any good for me. I am going to sign a 72-hour for discharge and get out of here, to go use of more drugs and drink alcohol.  Apparently, patient is detoxing from multiple psychoactive substances.  He plans to be discharged to sober living of [place] near [place] area.  Active listening and emotional support provided to patient for ongoing stressors.  Reports suicidal ideation without plans or intent.  Report auditory hallucination of hearing mumbling, possibly due to substance induced psychosis. Reports that anxiety of 10/10 with 10 being high severity.  Encouraged to receive as needed for anxiety as needed. Nursing staff report  patient sleeping over 9.5 hours last night. Appetite is good Concentration is reduced due to agitation Energy level is adequate.  Denies having side effects to current psychiatric medications.   We discussed compliance to current medication regimen.   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 Prior inpatient treatment: Yes x 3, 2 times at [facility], and 1 time at [facility] Current/prior outpatient treatment: Denies Prior rehab hx: Yes at [facility] Psychotherapy hx: Yes History of suicide: Denies History of homicide or aggression: Denies Psychiatric medication history: Yes, patient has been on trial of Seroquel , BuSpar , trazodone  Psychiatric medication compliance history: Noncompliance Neuromodulation history: Denies neuromodulation Current Psychiatrist: Denies current psychiatrist Current therapist: Denies current therapist  Past Medical History:  Past Medical History:  Diagnosis Date   Anxiety    Articular cartilage disorder of knee, 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 No pertinent surgical history. Family History:  Family History  Problem Relation Age of Onset   Heart disease Mother    Hypertension Mother    Diabetes Maternal Uncle    Prostate cancer Father    Family Psychiatric  History: See H&P Social History:  Social History   Substance and Sexual Activity  Alcohol Use Not Currently     Social History   Substance and Sexual Activity  Drug Use Not Currently    Social History   Socioeconomic History   Marital status: Single    Spouse name: Not on file  Number of children: Not on file   Years of education: Not on file   Highest education level: Not on file  Occupational History   Not on file  Tobacco Use   Smoking status: Some Days     Types: Cigarettes   Smokeless tobacco: Never   Tobacco comments:    state that he smokes 3 cigarettes a day  Vaping Use   Vaping status: Never Used  Substance and Sexual Activity   Alcohol use: Not Currently   Drug use: Not Currently   Sexual activity: Not Currently  Other Topics Concern   Not on file  Social History Narrative   Not on file   Social Drivers of Health   Financial Resource Strain: Not on file  Food Insecurity: Food Insecurity Present (01/21/2024)   Hunger Vital Sign    Worried About Running Out of Food in the Last Year: Sometimes true    Ran Out of Food in the Last Year: Sometimes true  Transportation Needs: No Transportation Needs (01/21/2024)   PRAPARE - Administrator, Civil Service (Medical): No    Lack of Transportation (Non-Medical): No  Physical Activity: Not on file  Stress: Not on file  Social Connections: Not on file   Additional Social History:   Sleep: Good Estimated Sleeping Duration (Last 24 Hours): 6.00-7.50 hours  Appetite:  Good  Current Medications: Current Facility-Administered Medications  Medication Dose Route Frequency Provider Last Rate Last Admin   acetaminophen  (TYLENOL ) tablet 650 mg  650 mg Oral Q6H PRN Arloa Suzen RAMAN, NP   650 mg at 01/22/24 1807   alum & mag hydroxide-simeth (MAALOX/MYLANTA) 200-200-20 MG/5ML suspension 30 mL  30 mL Oral Q4H PRN Arloa Suzen RAMAN, NP       busPIRone  (BUSPAR ) tablet 10 mg  10 mg Oral BID Arloa Suzen RAMAN, NP   10 mg at 01/23/24 9288   cloNIDine (CATAPRES) tablet 0.1 mg  0.1 mg Oral Letha Arloa Suzen RAMAN, NP       Followed by   NOREEN ON 01/26/2024] cloNIDine (CATAPRES) tablet 0.1 mg  0.1 mg Oral QAC breakfast Arloa Suzen RAMAN, NP       dicyclomine (BENTYL) tablet 20 mg  20 mg Oral Q6H PRN Arloa Suzen RAMAN, NP       haloperidol (HALDOL) tablet 5 mg  5 mg Oral TID PRN Arloa Suzen RAMAN, NP       And   diphenhydrAMINE  (BENADRYL ) capsule 50 mg  50 mg Oral TID PRN  Arloa Suzen RAMAN, NP       haloperidol lactate (HALDOL) injection 5 mg  5 mg Intramuscular TID PRN Arloa Suzen RAMAN, NP       And   diphenhydrAMINE  (BENADRYL ) injection 50 mg  50 mg Intramuscular TID PRN Arloa Suzen RAMAN, NP       And   LORazepam  (ATIVAN ) injection 2 mg  2 mg Intramuscular TID PRN Arloa Suzen RAMAN, NP       haloperidol lactate (HALDOL) injection 10 mg  10 mg Intramuscular TID PRN Arloa Suzen RAMAN, NP       And   diphenhydrAMINE  (BENADRYL ) injection 50 mg  50 mg Intramuscular TID PRN Arloa Suzen RAMAN, NP       And   LORazepam  (ATIVAN ) injection 2 mg  2 mg Intramuscular TID PRN Arloa Suzen RAMAN, NP       hydrOXYzine  (ATARAX ) tablet 25 mg  25 mg Oral Q6H PRN Arloa Suzen RAMAN, NP   25 mg  at 01/23/24 0748   hydrOXYzine  (ATARAX ) tablet 25 mg  25 mg Oral Q6H PRN Arloa Suzen RAMAN, NP   25 mg at 01/22/24 1832   loperamide (IMODIUM) capsule 2-4 mg  2-4 mg Oral PRN Arloa Suzen RAMAN, NP       LORazepam  (ATIVAN ) tablet 1 mg  1 mg Oral Q6H PRN Arloa Suzen RAMAN, NP       LORazepam  (ATIVAN ) tablet 1 mg  1 mg Oral BID Arloa Suzen RAMAN, NP       Followed by   NOREEN ON 01/25/2024] LORazepam  (ATIVAN ) tablet 1 mg  1 mg Oral Daily Arloa Suzen RAMAN, NP       magnesium  hydroxide (MILK OF MAGNESIA) suspension 30 mL  30 mL Oral Daily PRN Arloa Suzen RAMAN, NP       methocarbamol (ROBAXIN) tablet 500 mg  500 mg Oral Q8H PRN Arloa Suzen RAMAN, NP   500 mg at 01/22/24 2136   multivitamin with minerals tablet 1 tablet  1 tablet Oral Daily Arloa Suzen RAMAN, NP   1 tablet at 01/23/24 0711   [START ON 01/25/2024] naltrexone  (DEPADE) tablet 25 mg  25 mg Oral Daily Arloa Suzen RAMAN, NP       naproxen  (NAPROSYN ) tablet 500 mg  500 mg Oral BID PRN Arloa Suzen RAMAN, NP       nicotine  (NICODERM CQ  - dosed in mg/24 hours) patch 21 mg  21 mg Transdermal Q0600 Arloa Suzen RAMAN, NP       nicotine  polacrilex (NICORETTE) gum 2 mg  2 mg Oral PRN Prentis Kitchens A, DO   2 mg at 01/23/24  0955   ondansetron (ZOFRAN-ODT) disintegrating tablet 4 mg  4 mg Oral Q6H PRN Arloa Suzen RAMAN, NP       QUEtiapine  (SEROQUEL ) tablet 150 mg  150 mg Oral QHS Adell Panek C, FNP   150 mg at 01/22/24 2136   QUEtiapine  (SEROQUEL ) tablet 50 mg  50 mg Oral Daily Arloa Suzen RAMAN, NP   50 mg at 01/23/24 9288   thiamine  (Vitamin B-1) tablet 100 mg  100 mg Oral Daily Arloa Suzen RAMAN, NP   100 mg at 01/23/24 9288   traZODone  (DESYREL ) tablet 50 mg  50 mg Oral QHS PRN Arloa Suzen RAMAN, NP       Lab Results: No results found for this or any previous visit (from the past 48 hours).  Blood Alcohol level:  Lab Results  Component Value Date   ETH 37 (H) 01/21/2024   ETH <10 08/07/2020   Metabolic Disorder Labs: Lab Results  Component Value Date   HGBA1C 5.8 (H) 01/21/2024   MPG 119.76 01/21/2024   No results found for: PROLACTIN Lab Results  Component Value Date   CHOL 176 01/21/2024   TRIG 42 01/21/2024   HDL 92 01/21/2024   CHOLHDL 1.9 01/21/2024   VLDL 8 01/21/2024   LDLCALC 76 01/21/2024   LDLCALC 98 06/23/2020   Physical Findings: AIMS:  ,  ,  ,  ,  ,  ,   CIWA:  CIWA-Ar Total: 3 COWS:  COWS Total Score: 3  Musculoskeletal: Strength & Muscle Tone: within normal limits Gait & Station: normal Patient leans: N/A  Psychiatric Specialty Exam:  Presentation  General Appearance:  Casual  Eye Contact: Good  Speech: Clear and Coherent  Speech Volume: Normal  Handedness: Right  Mood and Affect  Mood: Angry; Anxious; Depressed; Dysphoric; Irritable  Affect: Blunt  Thought Process  Thought Processes: Coherent  Descriptions of Associations:Intact  Orientation:Full (Time, Place and Person)  Thought Content:Logical  History of Schizophrenia/Schizoaffective disorder:No  Duration of Psychotic Symptoms:No data recorded Hallucinations:Hallucinations: None  Ideas of Reference:Paranoia  Suicidal Thoughts:Suicidal Thoughts: Yes, Passive SI Passive  Intent and/or Plan: Without Intent; Without Plan; Without Means to Carry Out; Without Access to Means  Homicidal Thoughts:Homicidal Thoughts: No  Sensorium  Memory: Immediate Fair  Judgment: Poor  Insight: Shallow  Executive Functions  Concentration: Fair  Attention Span: Fair  Recall: [employer] of Knowledge: Fair  Language: Fair (Cursing on the staff. Stating, You all are not doing anything for me. I want to leave. Pt on COWS and CIWA Protocol.)  Psychomotor Activity  Psychomotor Activity: Psychomotor Activity: Normal  Assets  Assets: Communication Skills; Physical Health; Resilience  Sleep  Sleep: Sleep: Good Number of Hours of Sleep: 9.5  Physical Exam: Physical Exam Vitals and nursing note reviewed.  Constitutional:      General: He is not in acute distress.    Appearance: He is not ill-appearing.  HENT:     Head: Normocephalic.     Right Ear: External ear normal.     Left Ear: External ear normal.     Nose: Nose normal.     Mouth/Throat:     Mouth: Mucous membranes are moist.     Pharynx: Oropharynx is clear.  Eyes:     Extraocular Movements: Extraocular movements intact.  Cardiovascular:     Rate and Rhythm: Normal rate.     Pulses: Normal pulses.  Pulmonary:     Effort: Pulmonary effort is normal. No respiratory distress.  Abdominal:     Comments: Deferred   Musculoskeletal:        General: Normal range of motion.     Cervical back: Normal range of motion.     Comments: Deferred   Skin:    General: Skin is warm.  Neurological:     General: No focal deficit present.     Mental Status: He is alert and oriented to person, place, and time.  Psychiatric:     Comments: Agitated, angry and cursing at the staff.    Review of Systems  Constitutional:  Negative for chills and fever.  HENT:  Negative for sore throat.   Eyes:  Negative for blurred vision.  Respiratory:  Negative for cough, sputum production, shortness of breath and  wheezing.   Cardiovascular:  Negative for chest pain and palpitations.  Gastrointestinal:  Negative for abdominal pain, constipation, diarrhea, heartburn, nausea and vomiting.  Genitourinary:  Negative for dysuria, frequency and urgency.  Musculoskeletal:  Negative for falls.  Skin:  Negative for itching and rash.  Neurological:  Negative for dizziness and headaches.  Endo/Heme/Allergies:        See allergy listing  Psychiatric/Behavioral:  Positive for depression, hallucinations and suicidal ideas. Negative for substance abuse. The patient is nervous/anxious. The patient does not have insomnia.    Blood pressure 110/79, pulse 64, temperature 98.8 F (37.1 C), temperature source Oral, resp. rate 18, height 6' 2 (1.88 m), weight 76.2 kg, SpO2 100%. Body mass index is 21.57 kg/m.  Treatment Plan Summary: Daily contact with patient to assess and evaluate symptoms and progress in treatment and Medication management Physician Treatment Plan for Primary Diagnosis:  Assessment: Tyrone White is a 38 year old African-American male with prior psychiatric history significant for Alcohol Use Disorder, Opioid Use disorder, Generalized Anxiety Disorder, Major Depressive Disorder, who presents voluntarily to [facility] from [facility] with complaints of alcohol and substance addiction with recurrent suicidal ideations. After medical evaluation/stabilization & clearance, he was transferred to the [facility] for further psychiatric evaluation & treatments.     Alcohol-induced mood disorder (HCC)   Plans: Medications: --Continue Seroquel  tablets 50 mg p.o. daily for mood stabilization --Continue Seroquel  tablets 150 mg p.o. at bedtime for mood stabilization --Continue naltrexone  tablet 25 mg p.o. daily x 7 doses on my for opioid and alcohol withdrawal --Continue BuSpar  tablets 10 mg p.o. twice daily x 13 doses only   Clonidine detox protocol: See MAR   Ativan  detox protocol: See  MAR   Other PRN Medications  -Acetaminophen  650 mg every 6 as needed/mild pain  -Maalox 30 mL oral every 4 as needed/digestion  -Magnesium  hydroxide 30 mL daily as needed/mild constipation    --The risks/benefits/side-effects/alternatives to this medication were discussed in detail with the patient and time was given for questions. The patient consents to medication trial.   -- Metabolic profile and EKG monitoring obtained while on an atypical antipsychotic (BMI: Lipid Panel: HbgA1c: QTc:)   -- Encouraged patient to participate in unit milieu and in scheduled group therapies    Continue BH Agitation Protocol  --Haldol 5 mg, oral, 3 times daily as needed, mild agitation  --Benadryl  50 mg, oral, 3 times daily as needed, mild agitation                                      OR   --Haldol injection 5 mg, IM, 3 times daily as needed, moderate agitation  --Benadryl  injection 50 mg, IM, 3 times daily as needed, moderate agitation  --Ativan  injection 2 mg, IM, 3 times daily as needed, moderate agitation                                      OR  --Haldol injection 10 mg, IM, 3 times daily as needed, severe agitation  --Benadryl  injection 50 mg, IM, 3 times daily as needed, severe agitation  --Ativan  injection 2 mg, IM, 3 times daily as needed, severe agitation    Admission labs reviewed: CMP:BUN < 5,AST 54, ALT 45, Lipid Level: WNL. CBC with Diff: WNL. HgBA1C: 5.8.  TSH: 0.376. BAL:37. UDS: positive for Buprenorphine, Methamphetamine, Oxycodone, Marijuana   New labs ordered: None   EKG reviewed: NSR with sinus Arrhythmia, ventricular rate, QT/Qtc 392/394   Safety and Monitoring:  Voluntary admission to inpatient psychiatric unit for safety, stabilization and treatment  Daily contact with patient to assess and evaluate symptoms and progress in treatment  Patient's case to be discussed in multi-disciplinary team meeting  Observation Level : q15 minute checks  Vital signs: q12 hours  Precautions:  suicide, but pt currently verbally contracts for safety on unit?    Discharge Planning:  Social work and case management to assist with discharge planning and identification of hospital follow-up needs prior to discharge  Estimated LOS: 5-7?days  Discharge Concerns: Need to establish a safety plan; Medication compliance and effectiveness  Discharge Goals: Return home with outpatient referrals for mental health follow-up including medication management/psychotherapy.    Long Term Goal(s): Improvement in symptoms so as ready for discharge   Short Term Goals: Ability to identify changes in lifestyle to reduce recurrence of condition will improve, Ability to verbalize feelings will improve, Ability to disclose and  discuss suicidal ideas, Ability to demonstrate self-control will improve, Ability to identify and develop effective coping behaviors will improve, Ability to maintain clinical measurements within normal limits will improve, Compliance with prescribed medications will improve, and Ability to identify triggers associated with substance abuse/mental health issues will improve   Physician Treatment Plan for Secondary Diagnosis: Principal Problem:   Alcohol-induced mood disorder (HCC)  Ellouise JAYSON Azure, FNP 01/23/2024, 10:13 AM

## 2024-01-23 NOTE — Group Note (Signed)
 Date:  01/23/2024 Time:  8:52 PM  Group Topic/Focus:  Wrap-Up Group:   The focus of this group is to help patients review their daily goal of treatment and discuss progress on daily workbooks. Narcotics Anonymous (NA) Meeting   Participation Level:  Active  Participation Quality:  Appropriate  Affect:  Appropriate  Cognitive:  Appropriate  Insight: Appropriate  Engagement in Group:  Engaged  Modes of Intervention:  Discussion and Support  Additional Comments:  Patient attended NA  Eward Mace 01/23/2024, 8:52 PM

## 2024-01-24 DIAGNOSIS — F1094 Alcohol use, unspecified with alcohol-induced mood disorder: Secondary | ICD-10-CM | POA: Diagnosis not present

## 2024-01-24 NOTE — Plan of Care (Signed)
 ?  Problem: Activity: ?Goal: Interest or engagement in activities will improve ?Outcome: Progressing ?Goal: Sleeping patterns will improve ?Outcome: Progressing ?  ?Problem: Coping: ?Goal: Ability to verbalize frustrations and anger appropriately will improve ?Outcome: Progressing ?Goal: Ability to demonstrate self-control will improve ?Outcome: Progressing ?  ?Problem: Safety: ?Goal: Periods of time without injury will increase ?Outcome: Progressing ?  ?

## 2024-01-24 NOTE — Group Note (Signed)
 Date:  01/24/2024 Time:  9:56 PM  Group Topic/Focus:  Wrap-Up Group:   The focus of this group is to help patients review their daily goal of treatment and discuss progress on daily workbooks.    Participation Level:  Active  Participation Quality:  Appropriate and Attentive  Affect:  Appropriate  Cognitive:  Alert and Appropriate  Insight: Appropriate and Good  Engagement in Group:  Engaged  Modes of Intervention:  Discussion and Education  Additional Comments:  Pt attended and participated in wrap up group this evening and rated their day a 5/10. Pt stated they completed were able to reach out to their school to communicate that they are in the hospital. Pt completed their goal, which was to continue to work on their patience, as well as work on being transferred to a long term treatment facility. Pt is complaining of nausea and night sweats.  Tyrone White 01/24/2024, 9:56 PM

## 2024-01-24 NOTE — Progress Notes (Signed)
 Patient ID: Tyrone White, male   DOB: [birth year], 38 y.o.   MRN: [id number] CSW spoke with Pt on the unit and made him aware that application to [facility] was faxed with receipt. He decided to reach out and they report that they will review application and follow-up around Noon. He was provided with the phone number for the unit.  1350 Spoke with Pt on the unit as well as receiving message from other CSW staff stating that Pt would need an assessment sent to [facility] for review. In speaking with Pt it is unclear whether they have bed availability at this time.   CSW faxed initial psychiatric H&P as well as psychosocial assessment, for review. Confirmed successful fax submission.     CSW team will continue to follow for care coordination and discharge planning needs.   Tyrone Stephanie N Dejanee Thibeaux, LCSW 01/24/24 5:49 PM

## 2024-01-24 NOTE — Progress Notes (Signed)
   01/24/24 0814  Psych Admission Type (Psych Patients Only)  Admission Status Voluntary  Psychosocial Assessment  Patient Complaints Anxiety;Depression;Irritability  Eye Contact Fair  Facial Expression Anxious;Worried  Affect Anxious;Irritable  Surveyor, minerals Activity Other (Comment) (WNL)  Appearance/Hygiene Unremarkable  Behavior Characteristics Cooperative;Anxious  Mood Depressed;Anxious;Irritable;Preoccupied  Thought Process  Coherency WDL  Content WDL  Delusions None reported or observed  Perception WDL  Hallucination None reported or observed  Judgment Impaired  Confusion None  Danger to Self  Agreement Not to Harm Self Yes  Description of Agreement Verbal  Danger to Others  Danger to Others None reported or observed

## 2024-01-24 NOTE — Plan of Care (Signed)
  Problem: Education: Goal: Emotional status will improve Outcome: Progressing Goal: Mental status will improve Outcome: Progressing Goal: Verbalization of understanding the information provided will improve Outcome: Progressing   Problem: Activity: Goal: Interest or engagement in activities will improve Outcome: Progressing   Problem: Safety: Goal: Periods of time without injury will increase Outcome: Progressing

## 2024-01-24 NOTE — Progress Notes (Signed)
   01/23/24 2208  Psych Admission Type (Psych Patients Only)  Admission Status Voluntary/72 hour document signed  Psychosocial Assessment  Patient Complaints Anxiety;Depression;Irritability  Eye Contact Fair  Facial Expression Flat  Affect Anxious;Irritable  Speech Logical/coherent  Interaction Assertive  Motor Activity Other (Comment) (WDL)  Appearance/Hygiene Unremarkable  Behavior Characteristics Cooperative  Mood Irritable  Thought Process  Coherency WDL  Content WDL  Delusions None reported or observed  Perception WDL  Hallucination None reported or observed  Judgment Impaired  Confusion None  Danger to Self  Current suicidal ideation? Denies  Agreement Not to Harm Self Yes  Description of Agreement Verbal  Danger to Others  Danger to Others None reported or observed

## 2024-01-24 NOTE — Progress Notes (Signed)
 [facility] MD Progress Note  01/24/2024 12:36 PM Rani Sisney  MRN:  [id number]   Principal Problem: Mood disorder (HCC) Diagnosis: Principal Problem:   Mood disorder (HCC) Active Problems:   Alcohol-induced mood disorder (HCC)  Reason for admission:   Omarri Rickman is a 38 year old African-American male with prior psychiatric history significant for Alcohol Use Disorder, Opioid Use disorder, Generalized Anxiety Disorder, Major Depressive Disorder, who presents voluntarily to [facility] from [facility] with complaints of alcohol and substance addiction with recurrent suicidal ideations.   24-hour chart review: Vital signs: BP 110/71, HR 86 As needed medications: Hydroxyzine  and Tylenol  required for anxiety and body aches Patient compliant with scheduled psychotropic medications  Today's assessment notes: Wisam presents alert, calm, cooperative, and oriented to person, time, place, and situation.  Reports, My mood is better today and a little pleasant. However, I am still withdrawing from the drugs and alcohol. I feel sweaty, chills, sneezing, and vomited last night.  My nurse told me that the Ativan  detox protocol is completed, but I still need some more.  Patient continues on Clonidine detox protocol. COWS score of 3, and CIWA score of 1, noted and nursing flowsheet.  Nursing staff report patient rescinding his 72-hour for discharge.  Observe attending and participating in unit group activities and therapeutic milieu. He plans to be discharged to [facility] [place] near [place] area. Denies SI, HI, or VH. He reports possibly dreaming or auditory hallucination of hearing mumbling.  This could possibly be due to substance induced hallucination.  No agitation per Nursing staff. Reports that anxiety of 5/10 with 10 being high severity.  Encouraged to receive as needed for anxiety as needed. Nursing staff report patient sleeping over 9 hours last night. Appetite is  good Concentration is improved with Energy level is adequate.  Denies having side effects to current psychiatric medications.   We discussed compliance to current medication regimen.   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 Prior inpatient treatment: Yes x 3, 2 times at [facility], and 1 time at [facility] Current/prior outpatient treatment: Denies Prior rehab hx: Yes at [facility] Psychotherapy hx: Yes History of suicide: Denies History of homicide or aggression: Denies Psychiatric medication history: Yes, patient has been on trial of Seroquel , BuSpar , trazodone  Psychiatric medication compliance history: Noncompliance Neuromodulation history: Denies neuromodulation Current Psychiatrist: Denies current psychiatrist Current therapist: Denies current therapist  Past Medical History:  Past Medical History:  Diagnosis Date   Anxiety    Articular cartilage disorder of knee, 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 No pertinent surgical history. Family History:  Family History  Problem Relation Age of Onset   Heart disease Mother    Hypertension Mother    Diabetes Maternal Uncle    Prostate cancer Father    Family Psychiatric  History: See H&P Social History:  Social History   Substance and Sexual Activity  Alcohol Use Not Currently     Social History   Substance and Sexual Activity  Drug Use Not Currently    Social History   Socioeconomic History   Marital status: Single    Spouse name: Not on file   Number of children: Not on file   Years of education: Not on file   Highest education level: Not on file  Occupational History   Not on file  Tobacco Use   Smoking status: Some Days    Types: Cigarettes   Smokeless tobacco: Never   Tobacco comments:     state that he smokes 3 cigarettes a day  Vaping Use   Vaping status: Never Used  Substance and Sexual Activity   Alcohol use: Not Currently   Drug use: Not Currently   Sexual activity: Not Currently  Other Topics Concern   Not on file  Social History Narrative   Not on file   Social Drivers of Health   Financial Resource Strain: Not on file  Food Insecurity: Food Insecurity Present (01/21/2024)   Hunger Vital Sign    Worried About Running Out of Food in the Last Year: Sometimes true    Ran Out of Food in the Last Year: Sometimes true  Transportation Needs: No Transportation Needs (01/21/2024)   PRAPARE - Administrator, Civil Service (Medical): No    Lack of Transportation (Non-Medical): No  Physical Activity: Not on file  Stress: Not on file  Social Connections: Not on file   Additional Social History:   Sleep: Good Estimated Sleeping Duration (Last 24 Hours): 5.75-6.75 hours  Appetite:  Good  Current Medications: Current Facility-Administered Medications  Medication Dose Route Frequency Provider Last Rate Last Admin   acetaminophen  (TYLENOL ) tablet 650 mg  650 mg Oral Q6H PRN Arloa Suzen RAMAN, NP   650 mg at 01/24/24 0814   alum & mag hydroxide-simeth (MAALOX/MYLANTA) 200-200-20 MG/5ML suspension 30 mL  30 mL Oral Q4H PRN Arloa Suzen RAMAN, NP       busPIRone  (BUSPAR ) tablet 10 mg  10 mg Oral BID Arloa Suzen RAMAN, NP   10 mg at 01/24/24 0811   cloNIDine (CATAPRES) tablet 0.1 mg  0.1 mg Oral Letha Arloa Suzen RAMAN, NP   0.1 mg at 01/24/24 9188   Followed by   NOREEN ON 01/26/2024] cloNIDine (CATAPRES) tablet 0.1 mg  0.1 mg Oral QAC breakfast Arloa Suzen RAMAN, NP       dicyclomine (BENTYL) tablet 20 mg  20 mg Oral Q6H PRN Arloa Suzen RAMAN, NP       haloperidol (HALDOL) tablet 5 mg  5 mg Oral TID PRN Arloa Suzen RAMAN, NP       And   diphenhydrAMINE  (BENADRYL ) capsule 50 mg  50 mg Oral TID PRN Arloa Suzen RAMAN, NP       haloperidol lactate  (HALDOL) injection 5 mg  5 mg Intramuscular TID PRN Arloa Suzen RAMAN, NP       And   diphenhydrAMINE  (BENADRYL ) injection 50 mg  50 mg Intramuscular TID PRN Arloa Suzen RAMAN, NP       And   LORazepam  (ATIVAN ) injection 2 mg  2 mg Intramuscular TID PRN Arloa Suzen RAMAN, NP       haloperidol lactate (HALDOL) injection 10 mg  10 mg Intramuscular TID PRN Arloa Suzen RAMAN, NP       And   diphenhydrAMINE  (BENADRYL ) injection 50 mg  50 mg Intramuscular TID PRN Arloa Suzen RAMAN, NP       And   LORazepam  (ATIVAN ) injection 2 mg  2 mg Intramuscular TID PRN Arloa Suzen RAMAN, NP       hydrOXYzine  (ATARAX ) tablet 25 mg  25 mg Oral Q6H PRN Arloa Suzen RAMAN, NP   25 mg at 01/23/24 2111   magnesium  hydroxide (MILK OF MAGNESIA) suspension 30 mL  30 mL Oral Daily PRN Harris,  Suzen RAMAN, NP       methocarbamol (ROBAXIN) tablet 500 mg  500 mg Oral Q8H PRN Arloa Suzen RAMAN, NP   500 mg at 01/22/24 2136   multivitamin with minerals tablet 1 tablet  1 tablet Oral Daily Arloa Suzen RAMAN, NP   1 tablet at 01/24/24 0813   [START ON 01/25/2024] naltrexone  (DEPADE) tablet 25 mg  25 mg Oral Daily Arloa Suzen RAMAN, NP       naproxen  (NAPROSYN ) tablet 500 mg  500 mg Oral BID PRN Arloa Suzen RAMAN, NP       nicotine  (NICODERM CQ  - dosed in mg/24 hours) patch 21 mg  21 mg Transdermal Q0600 Arloa Suzen RAMAN, NP       nicotine  polacrilex (NICORETTE) gum 2 mg  2 mg Oral PRN Prentis Kitchens A, DO   2 mg at 01/24/24 1139   QUEtiapine  (SEROQUEL ) tablet 150 mg  150 mg Oral QHS Legacie Dillingham C, FNP   150 mg at 01/23/24 2109   thiamine  (Vitamin B-1) tablet 100 mg  100 mg Oral Daily Arloa Suzen RAMAN, NP   100 mg at 01/24/24 0813   traZODone  (DESYREL ) tablet 50 mg  50 mg Oral QHS PRN Arloa Suzen RAMAN, NP       Lab Results: No results found for this or any previous visit (from the past 48 hours).  Blood Alcohol level:  Lab Results  Component Value Date   ETH 37 (H) 01/21/2024   ETH <10 08/07/2020   Metabolic  Disorder Labs: Lab Results  Component Value Date   HGBA1C 5.8 (H) 01/21/2024   MPG 119.76 01/21/2024   No results found for: PROLACTIN Lab Results  Component Value Date   CHOL 176 01/21/2024   TRIG 42 01/21/2024   HDL 92 01/21/2024   CHOLHDL 1.9 01/21/2024   VLDL 8 01/21/2024   LDLCALC 76 01/21/2024   LDLCALC 98 06/23/2020   Physical Findings: AIMS:  ,  ,  ,  ,  ,  ,   CIWA:  CIWA-Ar Total: 1 COWS:  COWS Total Score: 3  Musculoskeletal: Strength & Muscle Tone: within normal limits Gait & Station: normal Patient leans: N/A  Psychiatric Specialty Exam:  Presentation  General Appearance:  Appropriate for Environment; Casual  Eye Contact: Good  Speech: Clear and Coherent; Normal Rate  Speech Volume: Normal  Handedness: Right  Mood and Affect  Mood: Anxious; Depressed  Affect: Appropriate; Congruent  Thought Process  Thought Processes: Coherent  Descriptions of Associations:Intact  Orientation:Full (Time, Place and Person)  Thought Content:Logical  History of Schizophrenia/Schizoaffective disorder:No  Duration of Psychotic Symptoms:No data recorded Hallucinations:Hallucinations: None  Ideas of Reference:None  Suicidal Thoughts:Suicidal Thoughts: No SI Passive Intent and/or Plan: -- (denies)  Homicidal Thoughts:Homicidal Thoughts: No  Sensorium  Memory: Immediate Good; Recent Good  Judgment: Fair  Insight: Fair  Art therapist  Concentration: Good  Attention Span: Good  Recall: Fair  Fund of Knowledge: Fair  Language: Good  Psychomotor Activity  Psychomotor Activity: Psychomotor Activity: Normal  Assets  Assets: Communication Skills; Physical Health; Resilience  Sleep  Sleep: Sleep: Good Number of Hours of Sleep: 9  Physical Exam: Physical Exam Vitals and nursing note reviewed.  Constitutional:      General: He is not in acute distress.    Appearance: He is not ill-appearing.  HENT:     Head:  Normocephalic.     Right Ear: External ear normal.     Left Ear: External ear normal.  Nose: Nose normal.     Mouth/Throat:     Mouth: Mucous membranes are moist.     Pharynx: Oropharynx is clear.  Eyes:     Extraocular Movements: Extraocular movements intact.  Cardiovascular:     Rate and Rhythm: Normal rate.     Pulses: Normal pulses.  Pulmonary:     Effort: Pulmonary effort is normal. No respiratory distress.  Abdominal:     Comments: Deferred   Genitourinary:    Comments: Deferred  Musculoskeletal:        General: Normal range of motion.     Cervical back: Normal range of motion.     Comments: Deferred   Skin:    General: Skin is warm.  Neurological:     General: No focal deficit present.     Mental Status: He is alert and oriented to person, place, and time.  Psychiatric:        Mood and Affect: Mood normal.        Behavior: Behavior normal.    Review of Systems  Constitutional:  Negative for chills and fever.  HENT:  Negative for sore throat.   Eyes:  Negative for blurred vision.  Respiratory:  Negative for cough, sputum production, shortness of breath and wheezing.   Cardiovascular:  Negative for chest pain and palpitations.  Gastrointestinal:  Negative for abdominal pain, constipation, diarrhea, heartburn, nausea and vomiting.  Genitourinary:  Negative for dysuria, frequency and urgency.  Musculoskeletal:  Negative for falls.  Skin:  Negative for itching and rash.  Neurological:  Negative for dizziness and headaches.  Endo/Heme/Allergies:        See allergy listing  Psychiatric/Behavioral:  Positive for depression and hallucinations. Negative for substance abuse and suicidal ideas. The patient is nervous/anxious. The patient does not have insomnia.    Blood pressure 110/71, pulse 86, temperature 97.8 F (36.6 C), temperature source Oral, resp. rate 16, height 6' 2 (1.88 m), weight 76.2 kg, SpO2 100%. Body mass index is 21.57 kg/m.  Treatment Plan  Summary: Daily contact with patient to assess and evaluate symptoms and progress in treatment and Medication management Physician Treatment Plan for Primary Diagnosis:  Assessment: Eder Topper is a 38 year old African-American male with prior psychiatric history significant for Alcohol Use Disorder, Opioid Use disorder, Generalized Anxiety Disorder, Major Depressive Disorder, who presents voluntarily to [facility] from [facility] with complaints of alcohol and substance addiction with recurrent suicidal ideations. After medical evaluation/stabilization & clearance, he was transferred to the [facility] for further psychiatric evaluation & treatments.     Alcohol-induced mood disorder (HCC)   Plans: Medications: --Discontinue Seroquel  tablets 50 mg p.o. daily for mood stabilization due to sleepiness. --Continue Seroquel  tablets 150 mg p.o. at bedtime for mood stabilization --Continue naltrexone  tablet 25 mg p.o. daily x 7 doses on my for opioid and alcohol withdrawal --Continue BuSpar  tablets 10 mg p.o. twice daily x 13 doses only   Clonidine detox protocol: See MAR   Ativan  detox protocol: See MAR   Other PRN Medications  -Acetaminophen  650 mg every 6 as needed/mild pain  -Maalox 30 mL oral every 4 as needed/digestion  -Magnesium  hydroxide 30 mL daily as needed/mild constipation    --The risks/benefits/side-effects/alternatives to this medication were discussed in detail with the patient and time was given for questions. The patient consents to medication trial.   -- Metabolic profile and EKG monitoring obtained while on an atypical antipsychotic (BMI: Lipid Panel: HbgA1c: QTc:)   -- Encouraged  patient to participate in unit milieu and in scheduled group therapies    Continue BH Agitation Protocol  --Haldol 5 mg, oral, 3 times daily as needed, mild agitation  --Benadryl  50 mg, oral, 3 times daily as needed, mild agitation                                      OR    --Haldol injection 5 mg, IM, 3 times daily as needed, moderate agitation  --Benadryl  injection 50 mg, IM, 3 times daily as needed, moderate agitation  --Ativan  injection 2 mg, IM, 3 times daily as needed, moderate agitation                                      OR  --Haldol injection 10 mg, IM, 3 times daily as needed, severe agitation  --Benadryl  injection 50 mg, IM, 3 times daily as needed, severe agitation  --Ativan  injection 2 mg, IM, 3 times daily as needed, severe agitation    Admission labs reviewed: CMP:BUN < 5,AST 54, ALT 45, Lipid Level: WNL. CBC with Diff: WNL. HgBA1C: 5.8.  TSH: 0.376. BAL:37. UDS: positive for Buprenorphine, Methamphetamine, Oxycodone, Marijuana   New labs ordered: None   EKG reviewed: NSR with sinus Arrhythmia, ventricular rate, QT/Qtc 392/394   Safety and Monitoring:  Voluntary admission to inpatient psychiatric unit for safety, stabilization and treatment  Daily contact with patient to assess and evaluate symptoms and progress in treatment  Patient's case to be discussed in multi-disciplinary team meeting  Observation Level : q15 minute checks  Vital signs: q12 hours  Precautions: suicide, but pt currently verbally contracts for safety on unit?    Discharge Planning:  Social work and case management to assist with discharge planning and identification of hospital follow-up needs prior to discharge  Estimated LOS: 5-7?days  Discharge Concerns: Need to establish a safety plan; Medication compliance and effectiveness  Discharge Goals: Return home with outpatient referrals for mental health follow-up including medication management/psychotherapy.    Long Term Goal(s): Improvement in symptoms so as ready for discharge   Short Term Goals: Ability to identify changes in lifestyle to reduce recurrence of condition will improve, Ability to verbalize feelings will improve, Ability to disclose and discuss suicidal ideas, Ability to demonstrate self-control will  improve, Ability to identify and develop effective coping behaviors will improve, Ability to maintain clinical measurements within normal limits will improve, Compliance with prescribed medications will improve, and Ability to identify triggers associated with substance abuse/mental health issues will improve   Physician Treatment Plan for Secondary Diagnosis: Principal Problem:   Alcohol-induced mood disorder (HCC)  Ellouise JAYSON Azure, FNP 01/24/2024, 12:36 PM Patient ID: Everet Dustman, male   DOB: 1986-04-24, 38 y.o.   MRN: [id number]

## 2024-01-24 NOTE — Group Note (Unsigned)
 Date:  01/24/2024 Time:  9:11 AM  Group Topic/Focus:  Goals Group:   The focus of this group is to help patients establish daily goals to achieve during treatment and discuss how the patient can incorporate goal setting into their daily lives to aide in recovery.     Participation Level:  {BHH PARTICIPATION OZCZO:77735}  Participation Quality:  {BHH PARTICIPATION QUALITY:22265}  Affect:  {BHH AFFECT:22266}  Cognitive:  {BHH COGNITIVE:22267}  Insight: {BHH Insight2:20797}  Engagement in Group:  {BHH ENGAGEMENT IN HMNLE:77731}  Modes of Intervention:  {BHH MODES OF INTERVENTION:22269}  Additional Comments:  ***  Tyrone White Dawn 01/24/2024, 9:11 AM

## 2024-01-24 NOTE — BHH Group Notes (Signed)
 BHH Group Notes:  (Nursing/MHT/Case Management/Adjunct)  Date:  01/24/2024  Time:  0830am   Type of Therapy:  The focus of this group is to help patients establish daily goals to achieve during treatment and discuss how the patient can incorporate goal setting into their daily lives to aTide in recovery.  Participation Level:  Active  Participation Quality:  Appropriate and Attentive  Affect:  Appropriate  Cognitive:  Alert and Appropriate  Insight:  Appropriate and Good  Engagement in Group:  Engaged  Modes of Intervention:  Discussion  Summary of Progress/Problems: Pt goal is getting mental health back strong.  Tyrone White 01/24/2024, 9:43 AM

## 2024-01-24 NOTE — Group Note (Signed)
 LCSW Group Therapy Note   Group Date: 01/24/2024 Start Time: 1100 End Time: 1200   Participation:  patient was present and actively participated in the conversation   Type of Therapy:  Group Therapy  Title:  From "One Day" to "Today is Day One": Begin Your Journey to Better Health and Mental Well-Being  Objective:  To educate participants on the importance of routine, sleep, diet, and movement for improving mental health and overall well-being. Encourage goal-setting for small, achievable changes.  3 Goals: Encourage participants to set one small, achievable goal related to sleep, diet, or movement for the coming week. Promote self-awareness by discussing the connection between physical and mental health. Support accountability by having participants share goals with the group.  Summary: Participants engaged in a discussion about lifestyle factors influencing mental health, including routines, sleep, nutrition, and movement. They set personal goals for the week to improve well-being and shared their goals for accountability.  Therapeutic Modalities: Cognitive Behavioral Therapy (CBT) for exploring the relationship between thoughts, feelings, and behaviors. Psychoeducation on lifestyle changes and their impact on mental health. Goal-setting to promote empowerment and motivation.   Aqsa Sensabaugh O Wanette Robison, LCSWA 01/24/2024  8:14 PM

## 2024-01-25 DIAGNOSIS — F1094 Alcohol use, unspecified with alcohol-induced mood disorder: Secondary | ICD-10-CM | POA: Diagnosis not present

## 2024-01-25 NOTE — BHH Group Notes (Signed)
 Psychoeducational Group Note  Date:  01/25/2024 Time:  2000  Group Topic/Focus:  Alcoholics Anonymous Meeting  Participation Level: Did Not Attend  Participation Quality:  Not Applicable  Affect:  Not Applicable  Cognitive:  Not Applicable  Insight:  Not Applicable  Engagement in Group: Not Applicable  Additional Comments:  Did not attend.   Lenora Manuelita RAMAN 01/25/2024, 9:50 PM

## 2024-01-25 NOTE — Progress Notes (Signed)
   01/25/24 0923  Psych Admission Type (Psych Patients Only)  Admission Status Voluntary  Psychosocial Assessment  Patient Complaints Anxiety;Depression  Eye Contact Fair  Facial Expression Flat  Affect Anxious;Irritable  Speech Logical/coherent  Interaction Assertive  Motor Activity Other (Comment) (WDL)  Appearance/Hygiene Unremarkable  Behavior Characteristics Cooperative  Mood Depressed  Thought Process  Coherency WDL  Content WDL  Delusions None reported or observed  Perception WDL  Hallucination None reported or observed  Judgment Impaired  Confusion None  Danger to Self  Current suicidal ideation? Passive  Description of Suicide Plan Hang self  Self-Injurious Behavior Some self-injurious ideation observed or expressed.  No lethal plan expressed   Agreement Not to Harm Self Yes  Description of Agreement Pt verbally contracts for safety while on the unit  Danger to Others  Danger to Others None reported or observed

## 2024-01-25 NOTE — Progress Notes (Signed)
 [facility] MD Progress Note  01/25/2024 3:03 PM Tyrone White  MRN:  [id number]   Principal Problem: Mood disorder (HCC) Diagnosis: Principal Problem:   Mood disorder (HCC) Active Problems:   Alcohol-induced mood disorder (HCC)  Reason for admission:   Tyrone White is a 38 year old African-American male with prior psychiatric history significant for Alcohol Use Disorder, Opioid Use disorder, Generalized Anxiety Disorder, Major Depressive Disorder, who presents voluntarily to [facility] from [facility] with complaints of alcohol and substance addiction with recurrent suicidal ideations.   24-hour chart review: Vital signs: BP 127/83, HR 85 As needed medications: Hydroxyzine  and Tylenol  required for anxiety and body aches.  Agitation protocol administered. Patient compliant with scheduled psychotropic medications  Today's assessment notes: On assessment today, the pt reports that his mood is depressed and rates depression as #10/10 with 10 being high severity.  Patient appears to be maximizing his symptoms to received agitation protocol. He reports, I am still withdrawing from the drugs and alcohol. I feel sweaty, chills, sneezing, and body cramps.  Nursing staff reports CIWA score of 6, and COWS score of 3.  Agitation protocol administered.  Nursing staff aware to administer agitation protocol without Ativan .  He denies SI, HI, or AVH.  He endorses plans to go to [facility] [place] after discharge from the hospital for treatment. Reports that anxiety is high and rates as # 10/10. Received PRN for anxiety Patient subjectively reports 0 sleep hours last night due to withdrawal symptoms from drugs.  However, nursing staff report patient sleeping 7.5 hours last night and feeling restful.   Patient reports good appetite.  Concentration is better  Energy level is adequate. Denies suicidal thoughts.  Further denies suicidal intent and plan during this  assessment. Denies having any HI.  Denies having psychotic symptoms.   Denies side effects to current psychiatric medications.   We discussed compliance to current medication regimen.   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 Prior inpatient treatment: Yes x 3, 2 times at [facility], and 1 time at [facility] Current/prior outpatient treatment: Denies Prior rehab hx: Yes at [facility] Psychotherapy hx: Yes History of suicide: Denies History of homicide or aggression: Denies Psychiatric medication history: Yes, patient has been on trial of Seroquel , BuSpar , trazodone  Psychiatric medication compliance history: Noncompliance Neuromodulation history: Denies neuromodulation Current Psychiatrist: Denies current psychiatrist Current therapist: Denies current therapist  Past Medical History:  Past Medical History:  Diagnosis Date   Anxiety    Articular cartilage disorder of knee, 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 No pertinent surgical history. Family History:  Family History  Problem Relation Age of Onset   Heart disease Mother    Hypertension Mother    Diabetes Maternal Uncle    Prostate cancer Father    Family Psychiatric  History: See H&P Social History:  Social History   Substance and Sexual Activity  Alcohol Use Not Currently     Social History   Substance and Sexual Activity  Drug Use Not Currently    Social History   Socioeconomic History   Marital status: Single    Spouse name: Not on file   Number of children: Not on file   Years of education: Not on file   Highest education level: Not on file  Occupational History   Not on file  Tobacco Use   Smoking status: Some Days    Types: Cigarettes   Smokeless tobacco: Never   Tobacco  comments:    state that he smokes 3 cigarettes a day  Vaping Use   Vaping status: Never Used  Substance and Sexual Activity   Alcohol use: Not Currently   Drug use: Not Currently   Sexual activity: Not Currently  Other Topics Concern   Not on file  Social History Narrative   Not on file   Social Drivers of Health   Financial Resource Strain: Not on file  Food Insecurity: Food Insecurity Present (01/21/2024)   Hunger Vital Sign    Worried About Running Out of Food in the Last Year: Sometimes true    Ran Out of Food in the Last Year: Sometimes true  Transportation Needs: No Transportation Needs (01/21/2024)   PRAPARE - Administrator, Civil Service (Medical): No    Lack of Transportation (Non-Medical): No  Physical Activity: Not on file  Stress: Not on file  Social Connections: Not on file   Additional Social History:   Sleep: Good Estimated Sleeping Duration (Last 24 Hours): 5.75-7.00 hours  Appetite:  Good  Current Medications: Current Facility-Administered Medications  Medication Dose Route Frequency Provider Last Rate Last Admin   acetaminophen  (TYLENOL ) tablet 650 mg  650 mg Oral Q6H PRN Arloa Suzen RAMAN, NP   650 mg at 01/25/24 0941   alum & mag hydroxide-simeth (MAALOX/MYLANTA) 200-200-20 MG/5ML suspension 30 mL  30 mL Oral Q4H PRN Arloa Suzen RAMAN, NP       busPIRone  (BUSPAR ) tablet 10 mg  10 mg Oral BID Arloa Suzen RAMAN, NP   10 mg at 01/25/24 0738   [START ON 01/26/2024] cloNIDine  (CATAPRES ) tablet 0.1 mg  0.1 mg Oral QAC breakfast Arloa Suzen RAMAN, NP       dicyclomine  (BENTYL ) tablet 20 mg  20 mg Oral Q6H PRN Arloa Suzen RAMAN, NP       haloperidol  (HALDOL ) tablet 5 mg  5 mg Oral TID PRN Arloa Suzen RAMAN, NP       And   diphenhydrAMINE  (BENADRYL ) capsule 50 mg  50 mg Oral TID PRN Arloa Suzen RAMAN, NP       haloperidol  lactate (HALDOL ) injection 5 mg  5 mg Intramuscular TID PRN Arloa Suzen RAMAN, NP   5 mg at 01/25/24 1325   And    diphenhydrAMINE  (BENADRYL ) injection 50 mg  50 mg Intramuscular TID PRN Arloa Suzen RAMAN, NP   50 mg at 01/25/24 1325   And   LORazepam  (ATIVAN ) injection 2 mg  2 mg Intramuscular TID PRN Arloa Suzen RAMAN, NP   2 mg at 01/25/24 1325   haloperidol  lactate (HALDOL ) injection 10 mg  10 mg Intramuscular TID PRN Arloa Suzen RAMAN, NP       And   diphenhydrAMINE  (BENADRYL ) injection 50 mg  50 mg Intramuscular TID PRN Arloa Suzen RAMAN, NP       And   LORazepam  (ATIVAN ) injection 2 mg  2 mg Intramuscular TID PRN Arloa Suzen RAMAN, NP       hydrOXYzine  (ATARAX ) tablet 25 mg  25 mg Oral Q6H PRN Arloa Suzen RAMAN, NP   25 mg at 01/25/24 1244   magnesium  hydroxide (MILK OF MAGNESIA) suspension 30 mL  30 mL Oral Daily PRN Arloa Suzen RAMAN, NP       methocarbamol  (ROBAXIN ) tablet 500 mg  500 mg Oral  Q8H PRN Arloa Suzen RAMAN, NP   500 mg at 01/25/24 0940   multivitamin with minerals tablet 1 tablet  1 tablet Oral Daily Arloa Suzen RAMAN, NP   1 tablet at 01/25/24 9262   naltrexone  (DEPADE) tablet 25 mg  25 mg Oral Daily Arloa Suzen RAMAN, NP   25 mg at 01/25/24 9261   naproxen  (NAPROSYN ) tablet 500 mg  500 mg Oral BID PRN Arloa Suzen RAMAN, NP   500 mg at 01/25/24 9380   nicotine  (NICODERM CQ  - dosed in mg/24 hours) patch 21 mg  21 mg Transdermal Q0600 Arloa Suzen RAMAN, NP   21 mg at 01/25/24 9261   nicotine  polacrilex (NICORETTE ) gum 2 mg  2 mg Oral PRN Prentis Kitchens A, DO   2 mg at 01/25/24 1324   QUEtiapine  (SEROQUEL ) tablet 150 mg  150 mg Oral QHS Lucas Exline C, FNP   150 mg at 01/24/24 2132   thiamine  (Vitamin B-1) tablet 100 mg  100 mg Oral Daily Arloa Suzen RAMAN, NP   100 mg at 01/25/24 9261   traZODone  (DESYREL ) tablet 50 mg  50 mg Oral QHS PRN Arloa Suzen RAMAN, NP   50 mg at 01/24/24 2132   Lab Results: No results found for this or any previous visit (from the past 48 hours).  Blood Alcohol level:  Lab Results  Component Value Date   ETH 37 (H) 01/21/2024   ETH <10  08/07/2020   Metabolic Disorder Labs: Lab Results  Component Value Date   HGBA1C 5.8 (H) 01/21/2024   MPG 119.76 01/21/2024   No results found for: PROLACTIN Lab Results  Component Value Date   CHOL 176 01/21/2024   TRIG 42 01/21/2024   HDL 92 01/21/2024   CHOLHDL 1.9 01/21/2024   VLDL 8 01/21/2024   LDLCALC 76 01/21/2024   LDLCALC 98 06/23/2020   Physical Findings: AIMS:  ,  ,  ,  ,  ,  ,   CIWA:  CIWA-Ar Total: 6 COWS:  COWS Total Score: 3  Musculoskeletal: Strength & Muscle Tone: within normal limits Gait & Station: normal Patient leans: N/A  Psychiatric Specialty Exam:  Presentation  General Appearance:  Appropriate for Environment; Casual; Fairly Groomed  Eye Contact: Good  Speech: Clear and Coherent  Speech Volume: Normal  Handedness: Right  Mood and Affect  Mood: Anxious; Depressed; Irritable  Affect: Congruent  Thought Process  Thought Processes: Coherent; Linear  Descriptions of Associations:Intact  Orientation:Full (Time, Place and Person)  Thought Content:Logical  History of Schizophrenia/Schizoaffective disorder:No  Duration of Psychotic Symptoms:No data recorded Hallucinations:Hallucinations: None  Ideas of Reference:None  Suicidal Thoughts:Suicidal Thoughts: No SI Passive Intent and/or Plan: -- (Denies)  Homicidal Thoughts:Homicidal Thoughts: No  Sensorium  Memory: Immediate Good; Recent Good  Judgment: Fair  Insight: Fair  Art therapist  Concentration: Good  Attention Span: Good  Recall: Fair  Fund of Knowledge: Fair  Language: Good  Psychomotor Activity  Psychomotor Activity: Psychomotor Activity: Normal  Assets  Assets: Communication Skills; Physical Health; Resilience  Sleep  Sleep: Sleep: Good Number of Hours of Sleep: 7.5 (Nursing staff report patient sleeping 7.5 hours.  However patient report sleeping 0 hours last night.  However appears restful.)  Physical Exam: Physical  Exam Vitals and nursing note reviewed.  Constitutional:      General: He is not in acute distress.    Appearance: He is not ill-appearing.  HENT:     Head: Normocephalic.     Right Ear: External ear normal.  Left Ear: External ear normal.     Nose: Nose normal.     Mouth/Throat:     Mouth: Mucous membranes are moist.     Pharynx: Oropharynx is clear.  Eyes:     Extraocular Movements: Extraocular movements intact.  Cardiovascular:     Rate and Rhythm: Normal rate.     Pulses: Normal pulses.  Pulmonary:     Effort: Pulmonary effort is normal. No respiratory distress.  Abdominal:     Comments: Deferred   Genitourinary:    Comments: Deferred  Musculoskeletal:        General: Normal range of motion.     Cervical back: Normal range of motion.     Comments: Deferred   Skin:    General: Skin is warm.  Neurological:     General: No focal deficit present.     Mental Status: He is alert and oriented to person, place, and time.  Psychiatric:        Mood and Affect: Mood normal.        Behavior: Behavior normal.    Review of Systems  Constitutional:  Negative for chills and fever.  HENT:  Negative for sore throat.   Eyes:  Negative for blurred vision.  Respiratory:  Negative for cough, sputum production, shortness of breath and wheezing.   Cardiovascular:  Negative for chest pain and palpitations.  Gastrointestinal:  Negative for abdominal pain, constipation, diarrhea, heartburn, nausea and vomiting.  Genitourinary:  Negative for dysuria, frequency and urgency.  Musculoskeletal:  Negative for falls.  Skin:  Negative for itching and rash.  Neurological:  Negative for dizziness and headaches.  Endo/Heme/Allergies:        See allergy listing  Psychiatric/Behavioral:  Positive for depression and hallucinations. Negative for substance abuse and suicidal ideas. The patient is nervous/anxious. The patient does not have insomnia.    Blood pressure 127/83, pulse 85, temperature 98.1  F (36.7 C), temperature source Oral, resp. rate 18, height 6' 2 (1.88 m), weight 76.2 kg, SpO2 100%. Body mass index is 21.57 kg/m.  Treatment Plan Summary: Daily contact with patient to assess and evaluate symptoms and progress in treatment and Medication management Physician Treatment Plan for Primary Diagnosis:  Assessment: Tyrone White is a 38 year old African-American male with prior psychiatric history significant for Alcohol Use Disorder, Opioid Use disorder, Generalized Anxiety Disorder, Major Depressive Disorder, who presents voluntarily to [facility] from [facility] with complaints of alcohol and substance addiction with recurrent suicidal ideations. After medical evaluation/stabilization & clearance, he was transferred to the [facility] for further psychiatric evaluation & treatments.     Alcohol-induced mood disorder (HCC)   Plans: Medications: --Discontinue Seroquel  tablets 50 mg p.o. daily for mood stabilization due to sleepiness. --Continue Seroquel  tablets 150 mg p.o. at bedtime for mood stabilization --Continue naltrexone  tablet 25 mg p.o. daily x 7 doses on my for opioid and alcohol withdrawal --Continue BuSpar  tablets 10 mg p.o. twice daily x 13 doses only   Clonidine  detox protocol: See MAR   Ativan  detox protocol: See MAR   Other PRN Medications  -Acetaminophen  650 mg every 6 as needed/mild pain  -Maalox 30 mL oral every 4 as needed/digestion  -Magnesium  hydroxide 30 mL daily as needed/mild constipation    --The risks/benefits/side-effects/alternatives to this medication were discussed in detail with the patient and time was given for questions. The patient consents to medication trial.   -- Metabolic profile and EKG monitoring obtained while on an atypical antipsychotic (  BMI: Lipid Panel: HbgA1c: QTc:)   -- Encouraged patient to participate in unit milieu and in scheduled group therapies    Continue BH Agitation Protocol  --Haldol  5 mg,  oral, 3 times daily as needed, mild agitation  --Benadryl  50 mg, oral, 3 times daily as needed, mild agitation                                      OR   --Haldol  injection 5 mg, IM, 3 times daily as needed, moderate agitation  --Benadryl  injection 50 mg, IM, 3 times daily as needed, moderate agitation  --Ativan  injection 2 mg, IM, 3 times daily as needed, moderate agitation                                      OR  --Haldol  injection 10 mg, IM, 3 times daily as needed, severe agitation  --Benadryl  injection 50 mg, IM, 3 times daily as needed, severe agitation  --Ativan  injection 2 mg, IM, 3 times daily as needed, severe agitation    Admission labs reviewed: CMP:BUN < 5,AST 54, ALT 45, Lipid Level: WNL. CBC with Diff: WNL. HgBA1C: 5.8.  TSH: 0.376. BAL:37. UDS: positive for Buprenorphine, Methamphetamine, Oxycodone, Marijuana   New labs ordered: None   EKG reviewed: NSR with sinus Arrhythmia, ventricular rate, QT/Qtc 392/394   Safety and Monitoring:  Voluntary admission to inpatient psychiatric unit for safety, stabilization and treatment  Daily contact with patient to assess and evaluate symptoms and progress in treatment  Patient's case to be discussed in multi-disciplinary team meeting  Observation Level : q15 minute checks  Vital signs: q12 hours  Precautions: suicide, but pt currently verbally contracts for safety on unit?    Discharge Planning:  Social work and case management to assist with discharge planning and identification of hospital follow-up needs prior to discharge  Estimated LOS: 5-7?days  Discharge Concerns: Need to establish a safety plan; Medication compliance and effectiveness  Discharge Goals: Return home with outpatient referrals for mental health follow-up including medication management/psychotherapy.    Long Term Goal(s): Improvement in symptoms so as ready for discharge   Short Term Goals: Ability to identify changes in lifestyle to reduce recurrence of  condition will improve, Ability to verbalize feelings will improve, Ability to disclose and discuss suicidal ideas, Ability to demonstrate self-control will improve, Ability to identify and develop effective coping behaviors will improve, Ability to maintain clinical measurements within normal limits will improve, Compliance with prescribed medications will improve, and Ability to identify triggers associated with substance abuse/mental health issues will improve.   Physician Treatment Plan for Secondary Diagnosis: Principal Problem:   Alcohol-induced mood disorder (HCC)  Ellouise JAYSON Azure, FNP 01/25/2024, 3:03 PM Patient ID: Tyrone White, male   DOB: [birth year], 38 y.o.   MRN: [id number] Patient ID: Tyrone White, male   DOB: 1986-07-21, 38 y.o.   MRN: [id number]

## 2024-01-25 NOTE — Plan of Care (Signed)
  Problem: Education: Goal: Emotional status will improve Outcome: Progressing Goal: Mental status will improve Outcome: Progressing Goal: Verbalization of understanding the information provided will improve Outcome: Progressing   Problem: Safety: Goal: Periods of time without injury will increase Outcome: Progressing   

## 2024-01-25 NOTE — Plan of Care (Signed)
   Problem: Education: Goal: Emotional status will improve Outcome: Progressing Goal: Mental status will improve Outcome: Progressing   Problem: Activity: Goal: Interest or engagement in activities will improve Outcome: Progressing Goal: Sleeping patterns will improve Outcome: Progressing

## 2024-01-25 NOTE — Group Note (Signed)
 Date:  01/25/2024 Time:  9:37 AM  Group Topic/Focus:  Goals Group:   The focus of this group is to help patients establish daily goals to achieve during treatment and discuss how the patient can incorporate goal setting into their daily lives to aide in recovery. Orientation:   The focus of this group is to educate the patient on the purpose and policies of crisis stabilization and provide a format to answer questions about their admission.  The group details unit policies and expectations of patients while admitted.    Participation Level:  Did Not Attend   Tyrone White Mars 01/25/2024, 9:37 AM

## 2024-01-25 NOTE — Progress Notes (Signed)
 Ellouise, NP provided verbal order face to face with writer to administer IM ativan , haldol , benadryl  for patient's expressed agitation. IM Haldol  5mg , Benadryl  50mg , and Ativan  2 mg administered per NP verbal order.

## 2024-01-25 NOTE — Progress Notes (Signed)
   01/24/24 2311  Psych Admission Type (Psych Patients Only)  Admission Status Voluntary  Psychosocial Assessment  Patient Complaints Anxiety;Depression;Irritability  Eye Contact Fair  Facial Expression Flat  Affect Anxious;Irritable  Speech Logical/coherent  Interaction Assertive  Motor Activity Other (Comment) (WDL)  Appearance/Hygiene Unremarkable  Behavior Characteristics Cooperative  Mood Preoccupied;Anxious;Irritable  Thought Process  Coherency WDL  Content WDL  Delusions None reported or observed  Perception WDL  Hallucination None reported or observed  Judgment Impaired  Confusion None  Danger to Self  Current suicidal ideation? Denies  Agreement Not to Harm Self Yes  Description of Agreement Verbal  Danger to Others  Danger to Others None reported or observed

## 2024-01-25 NOTE — Group Note (Signed)
 Date:  01/25/2024 Time:  1:09 PM  Group Topic/Focus:  Identifying Needs:   The focus of this group is to help patients identify their personal needs that have been historically problematic and identify healthy behaviors to address their needs.    Participation Level:  Active  Participation Quality:  Appropriate  Affect:  Appropriate  Cognitive:  Appropriate  Insight: Appropriate  Engagement in Group:  Developing/Improving  Modes of Intervention:  Discussion and Education  Additional Comments:    Tyrone White 01/25/2024, 1:09 PM

## 2024-01-26 DIAGNOSIS — F39 Unspecified mood [affective] disorder: Secondary | ICD-10-CM | POA: Diagnosis not present

## 2024-01-26 MED ORDER — SERTRALINE HCL 25 MG PO TABS
25.0000 mg | ORAL_TABLET | Freq: Every day | ORAL | Status: DC
  Administered 2024-01-26: 25 mg via ORAL
  Filled 2024-01-26 (×2): qty 1

## 2024-01-26 MED ORDER — QUETIAPINE FUMARATE 200 MG PO TABS
200.0000 mg | ORAL_TABLET | Freq: Every day | ORAL | Status: DC
  Administered 2024-01-26: 200 mg via ORAL
  Filled 2024-01-26: qty 1

## 2024-01-26 NOTE — Plan of Care (Signed)
   Problem: Education: Goal: Knowledge of [facility] Education information/materials will improve Outcome: Progressing Goal: Emotional status will improve Outcome: Progressing Goal: Mental status will improve Outcome: Progressing

## 2024-01-26 NOTE — Progress Notes (Addendum)
 Patient used call light to reach nursing staff and this Clinical research associate along with another nurse went into room to check on patient. Patient observed laying in bed with slurred, incoherent speech and tongue visibly swollen. Patient also noted to state he was cramping. Patient given benadryl  IM. Assigned nurse notified provider.

## 2024-01-26 NOTE — Progress Notes (Addendum)
 [facility] MD Progress Note  01/26/2024 1:45 PM Tyrone White  MRN:  [id number] Subjective:   38 year old African-American male, single, homeless, unemployed.  Background history of substance use disorder and substance-induced mood disorder.  Presented voluntarily intoxicated with multiple psychoactive substances.  Expressed suicidal thoughts with plan of jumping off a height.  Irritable on presentation. UDS positive for methamphetamine, buprenorphine, oxycodone and THC.  BAL 37 mg/dl  Chart reviewed today.  Patient discussed at multidisciplinary team meeting.  Nursing staff reports that patient slept for 4.7 hours.  He has been requesting as needed hydroxyzine  repeatedly.  We will observe response to internal stimuli.  Not endorsing any abnormal statements.  No challenging behavior on the unit.  At interview with patient, he tells me that he he is processing multiple deaths that has occurred in his family.  States that his uncle passed recently.  His mother passed 6 years ago.  Patient has not been able to process these losses as he has been suppressing to help with psychoactive substances.  He reports feeling depressed lately.  States that he has felt like wife is now working again.  He dismissed any active plans of harming himself.  There are no manic symptoms.  There are no psychotic symptoms.  No rageful thoughts towards others.  No side effects from his medication. We discussed use of sertraline  to target depression.  Patient consented to treatment after reviewing the pros and cons.  We will have agreed to optimize quetiapine  to target insomnia and help treatment with his mood . Encouraged to keep ventilating his feelings to staff.  Principal Problem: Mood disorder (HCC) Diagnosis: Principal Problem:   Mood disorder (HCC) Active Problems:   Alcohol-induced mood disorder (HCC)  Total Time spent with patient: 20 minutes  Past Psychiatric History:  See H&P.  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 No pertinent surgical history. Family History:  Family History  Problem Relation Age of Onset   Heart disease Mother    Hypertension Mother    Diabetes Maternal Uncle    Prostate cancer Father    Family Psychiatric  History:  See H&P.  Social History:  Social History   Substance and Sexual Activity  Alcohol Use Not Currently     Social History   Substance and Sexual Activity  Drug Use Not Currently    Social History   Socioeconomic History   Marital status: Single    Spouse name: Not on file   Number of children: Not on file   Years of education: Not on file   Highest education level: Not on file  Occupational History   Not on file  Tobacco Use   Smoking status: Some Days    Types: Cigarettes   Smokeless tobacco: Never   Tobacco comments:    state that he smokes 3 cigarettes a day  Vaping Use   Vaping status: Never Used  Substance and Sexual Activity   Alcohol use: Not Currently   Drug use: Not Currently   Sexual activity: Not Currently  Other Topics Concern   Not on file  Social History Narrative   Not on file   Social Drivers of Health   Financial Resource Strain: Not on file  Food Insecurity: Food Insecurity Present (01/21/2024)   Hunger Vital Sign    Worried About Running  Out of Food in the Last Year: Sometimes true    Ran Out of Food in the Last Year: Sometimes true  Transportation Needs: No Transportation Needs (01/21/2024)   PRAPARE - Administrator, Civil Service (Medical): No    Lack of Transportation (Non-Medical): No  Physical Activity: Not on file  Stress: Not on file  Social Connections: Not on file   Additional Social History:   Current Medications: Current Facility-Administered Medications  Medication Dose Route Frequency Provider Last Rate Last Admin    acetaminophen  (TYLENOL ) tablet 650 mg  650 mg Oral Q6H PRN Arloa Suzen RAMAN, NP   650 mg at 01/25/24 0941   alum & mag hydroxide-simeth (MAALOX/MYLANTA) 200-200-20 MG/5ML suspension 30 mL  30 mL Oral Q4H PRN Arloa Suzen RAMAN, NP       busPIRone  (BUSPAR ) tablet 10 mg  10 mg Oral BID Arloa Suzen RAMAN, NP   10 mg at 01/26/24 9196   cloNIDine  (CATAPRES ) tablet 0.1 mg  0.1 mg Oral QAC breakfast Arloa Suzen RAMAN, NP   0.1 mg at 01/26/24 9286   haloperidol  (HALDOL ) tablet 5 mg  5 mg Oral TID PRN Arloa Suzen RAMAN, NP       And   diphenhydrAMINE  (BENADRYL ) capsule 50 mg  50 mg Oral TID PRN Arloa Suzen RAMAN, NP       haloperidol  lactate (HALDOL ) injection 5 mg  5 mg Intramuscular TID PRN Arloa Suzen RAMAN, NP   5 mg at 01/25/24 1325   And   diphenhydrAMINE  (BENADRYL ) injection 50 mg  50 mg Intramuscular TID PRN Arloa Suzen RAMAN, NP   50 mg at 01/25/24 1325   And   LORazepam  (ATIVAN ) injection 2 mg  2 mg Intramuscular TID PRN Arloa Suzen RAMAN, NP   2 mg at 01/25/24 1325   haloperidol  lactate (HALDOL ) injection 10 mg  10 mg Intramuscular TID PRN Arloa Suzen RAMAN, NP       And   diphenhydrAMINE  (BENADRYL ) injection 50 mg  50 mg Intramuscular TID PRN Arloa Suzen RAMAN, NP       And   LORazepam  (ATIVAN ) injection 2 mg  2 mg Intramuscular TID PRN Arloa Suzen RAMAN, NP       magnesium  hydroxide (MILK OF MAGNESIA) suspension 30 mL  30 mL Oral Daily PRN Arloa Suzen RAMAN, NP       multivitamin with minerals tablet 1 tablet  1 tablet Oral Daily Arloa Suzen RAMAN, NP   1 tablet at 01/26/24 0803   naltrexone  (DEPADE) tablet 25 mg  25 mg Oral Daily Arloa Suzen RAMAN, NP   25 mg at 01/26/24 9196   nicotine  (NICODERM CQ  - dosed in mg/24 hours) patch 21 mg  21 mg Transdermal Q0600 Arloa Suzen RAMAN, NP   21 mg at 01/26/24 0700   nicotine  polacrilex (NICORETTE ) gum 2 mg  2 mg Oral PRN Prentis Kitchens A, DO   2 mg at 01/26/24 1248   QUEtiapine  (SEROQUEL ) tablet 150 mg  150 mg Oral QHS Ntuen, Tina  C, FNP   150 mg at 01/25/24 2108   thiamine  (Vitamin B-1) tablet 100 mg  100 mg Oral Daily Arloa Suzen RAMAN, NP   100 mg at 01/26/24 9196   traZODone  (DESYREL ) tablet 50 mg  50 mg Oral QHS PRN Arloa Suzen RAMAN, NP   50 mg at 01/24/24 2132    Lab Results: No results found for this or any previous visit (from the past 48 hours).  Blood Alcohol level:  Lab Results  Component Value Date   ETH 37 (H) 01/21/2024   ETH <10 08/07/2020    Metabolic Disorder Labs: Lab Results  Component Value Date   HGBA1C 5.8 (H) 01/21/2024   MPG 119.76 01/21/2024   No results found for: PROLACTIN Lab Results  Component Value Date   CHOL 176 01/21/2024   TRIG 42 01/21/2024   HDL 92 01/21/2024   CHOLHDL 1.9 01/21/2024   VLDL 8 01/21/2024   LDLCALC 76 01/21/2024   LDLCALC 98 06/23/2020    Physical Findings: AIMS:  ,  ,  ,  ,  ,  ,   CIWA:  CIWA-Ar Total: 2 COWS:  COWS Total Score: 2  Musculoskeletal: Strength & Muscle Tone: within normal limits Gait & Station: normal Patient leans: N/A  Psychiatric Specialty Exam:  Presentation  General Appearance:  In bed prior to interview, not in any distress, appropriate behavior, engaged politely.  No EPS.  Eye Contact: Good.  Speech: Spontaneous.  Soft spoken.  Rest  Mood and Affect  Mood: Worried and depressed.  Affect: Mood congruent. Thought Process  Thought Processes: Linear and goal directed.  Descriptions of Associations:Intact  Orientation:Full (Time, Place and Person)  Thought Content: Negative rumination about losses in his life.  No current suicidal thoughts.  No homicidal thoughts.  No thoughts of violence. No delusional theme.  No obsessions.  Hallucinations: No hallucination in any modality.  Sensorium  Memory: Good.  Judgment: Good.  Insight: Good  Executive Functions  Concentration: Good.  Attention Span: Good.  Recall: Good.  Fund of Knowledge: Good.  Language: Good   Psychomotor  Activity  Slightly decreased psychomotor activity   Physical Exam: Physical Exam ROS Blood pressure (!) 123/90, pulse 88, temperature 97.9 F (36.6 C), temperature source Oral, resp. rate 16, height 6' 2 (1.88 m), weight 76.2 kg, SpO2 99%. Body mass index is 21.57 kg/m.   Treatment Plan Summary: Patient is gradually detoxing from psychoactive substances.  He is beginning to process reality based issues that he previously avoided by using psychoactive substances.  He reports depression associated with anxiety.  No imminent dangerousness.  No psychotic symptoms.  No manic symptoms. We will adjust his medications as below.  1.  Increase quetiapine  to 200 mg at bedtime. 2.  Sertraline  25 mg daily.  Will titrate as needed/tolerated. 3.  Continue naltrexone  to 25 mg daily. 4.  Continue buspirone  10 mg twice daily. 5.  Continue to monitor mood behavior and interaction with others. 6.  Continue to motivate patient towards addiction treatment. 7.  Social worker will coordinate discharge and aftercare planning.   Jerrell DELENA Forehand, MD 01/26/2024, 1:45 PM

## 2024-01-26 NOTE — Progress Notes (Signed)
   01/26/24 0905  Psych Admission Type (Psych Patients Only)  Admission Status Voluntary  Psychosocial Assessment  Patient Complaints Agitation;Depression  Eye Contact Fair  Facial Expression Flat  Affect Anxious;Irritable  Speech Logical/coherent  Interaction Assertive  Motor Activity Other (Comment) (WNL)  Appearance/Hygiene Unremarkable  Behavior Characteristics Cooperative  Mood Depressed;Anxious  Thought Process  Coherency WDL  Content WDL  Delusions None reported or observed  Perception WDL  Hallucination None reported or observed  Judgment Impaired  Confusion None  Danger to Self  Current suicidal ideation? Denies  Agreement Not to Harm Self Yes  Description of Agreement Verbal  Danger to Others  Danger to Others None reported or observed

## 2024-01-26 NOTE — Plan of Care (Signed)
 ?  Problem: Activity: ?Goal: Interest or engagement in activities will improve ?Outcome: Progressing ?Goal: Sleeping patterns will improve ?Outcome: Progressing ?  ?Problem: Coping: ?Goal: Ability to verbalize frustrations and anger appropriately will improve ?Outcome: Progressing ?Goal: Ability to demonstrate self-control will improve ?Outcome: Progressing ?  ?Problem: Safety: ?Goal: Periods of time without injury will increase ?Outcome: Progressing ?  ?

## 2024-01-26 NOTE — Group Note (Signed)
 Date:  01/26/2024 Time:  10:30 AM  Group Topic/Focus:  Goals Group:   The focus of this group is to help patients establish daily goals to achieve during treatment and discuss how the patient can incorporate goal setting into their daily lives to aide in recovery.    Participation Level:  Active  Participation Quality:  Appropriate and Attentive  Affect:  Appropriate  Cognitive:  Alert and Appropriate  Insight: Appropriate  Engagement in Group:  Engaged  Modes of Intervention:  Discussion  Additional Comments:  Pt goal is to just be patient.  Tyrone White Dawn 01/26/2024, 10:30 AM

## 2024-01-26 NOTE — Progress Notes (Signed)
 Patient pressed call light requesting assistance. When arriving to the room, patient complained of swollen tongue and body cramping. Patient was restless in the bed and tongue appeared to be swollen. Speech was incoherent and slurred. IM benadryl  administered. Safety checks continue. Patient remains safe at this time.

## 2024-01-26 NOTE — Progress Notes (Signed)
   01/26/24 0000  Psych Admission Type (Psych Patients Only)  Admission Status Voluntary  Psychosocial Assessment  Patient Complaints Anxiety  Eye Contact Fair  Facial Expression Flat  Affect Anxious;Irritable  Speech Logical/coherent  Interaction Assertive  Motor Activity Other (Comment)  Appearance/Hygiene Unremarkable  Behavior Characteristics Cooperative  Mood Depressed  Thought Process  Coherency WDL  Content WDL  Delusions None reported or observed  Perception WDL  Hallucination None reported or observed  Judgment Impaired  Confusion None  Danger to Self  Current suicidal ideation? Denies (Denies)  Agreement Not to Harm Self Yes  Description of Agreement verbal  Danger to Others  Danger to Others None reported or observed

## 2024-01-27 DIAGNOSIS — F39 Unspecified mood [affective] disorder: Secondary | ICD-10-CM

## 2024-01-27 MED ORDER — NALTREXONE HCL 50 MG PO TABS: 50.0000 mg | ORAL_TABLET | Freq: Every day | ORAL | 0 refills | Status: AC

## 2024-01-27 MED ORDER — QUETIAPINE FUMARATE 200 MG PO TABS: 200.0000 mg | ORAL_TABLET | Freq: Every day | ORAL | 0 refills | Status: AC

## 2024-01-27 MED ORDER — BUSPIRONE HCL 10 MG PO TABS: 10.0000 mg | ORAL_TABLET | Freq: Two times a day (BID) | ORAL | 0 refills | Status: AC

## 2024-01-27 MED ORDER — NICOTINE 21 MG/24HR TD PT24: 21.0000 mg | MEDICATED_PATCH | Freq: Every day | TRANSDERMAL | 0 refills | Status: AC

## 2024-01-27 NOTE — Plan of Care (Signed)
  Problem: Education: Goal: Knowledge of [facility] Education information/materials will improve Outcome: Progressing Goal: Emotional status will improve Outcome: Progressing Goal: Mental status will improve Outcome: Progressing Goal: Verbalization of understanding the information provided will improve Outcome: Progressing   Problem: Activity: Goal: Interest or engagement in activities will improve Outcome: Progressing   Problem: Coping: Goal: Ability to verbalize frustrations and anger appropriately will improve Outcome: Progressing   Problem: Health Behavior/Discharge Planning: Goal: Identification of resources available to assist in meeting health care needs will improve Outcome: Progressing   Problem: Physical Regulation: Goal: Ability to maintain clinical measurements within normal limits will improve Outcome: Progressing   Problem: Safety: Goal: Periods of time without injury will increase Outcome: Progressing

## 2024-01-27 NOTE — [facility] Suicide Risk Assessment (Signed)
 [facility] Discharge Suicide Risk Assessment   Principal Problem: Mood disorder [facility]) Discharge Diagnoses: Principal Problem:   Mood disorder (HCC) Active Problems:   Alcohol-induced mood disorder (HCC)   Total Time spent with patient: 30 minutes  Musculoskeletal: Strength & Muscle Tone: within normal limits Gait & Station: normal Patient leans: N/A  Psychiatric Specialty Exam  Presentation  General Appearance:  Casually dressed, not in any distress, appropriate behavior, engaged politely.  No EPS.  Eye Contact: Good.  Speech: Spontaneous.  Normal rate, tone and volume.  Normal prosody of speech.  Mood and Affect  Mood: Euthymic.  Affect: Full range and appropriate.  Thought Process  Thought Processes: Linear and goal directed.  Descriptions of Associations:Intact  Orientation:Full (Time, Place and Person)  Thought Content: No current suicidal thoughts.  No homicidal thoughts.  No thoughts of violence.  No negative ruminative flooding.  No guilty ruminations.  No delusional theme.  No obsessions.  Hallucinations: No hallucination in any modality.  Sensorium  Memory: Good.  Judgment: Good.  Insight: Limited as he is not full motivated to deal with his addiction at this time.  Executive Functions  Concentration: Good.  Attention Span: Good.  Recall: Good.  Fund of Knowledge: Good.  Language: Good   Psychomotor Activity  Normal psychomotor activity  Physical Exam: Physical Exam ROS Blood pressure (!) 138/91, pulse 90, temperature 98.3 F (36.8 C), temperature source Oral, resp. rate 18, height 6' 2 (1.88 m), weight 76.2 kg, SpO2 100%. Body mass index is 21.57 kg/m.  Mental Status Per Nursing Assessment::   On Admission:  Suicidal ideation indicated by patient  Demographic Factors:  Male and Adolescent or young adult  Loss Factors: NA  Historical Factors: Family history of mental illness or substance abuse  Risk Reduction  Factors:   Positive social support, Positive therapeutic relationship, and Positive coping skills or problem solving skills  Continued Clinical Symptoms:  Patient has completely detoxed from psychoactive substances.  His mood is stable.  No residual manic or psychotic symptoms.  He is not pervasively depressed.  No overwhelming anxiety.  Cognitive Features That Contribute To Risk:  None    Suicide Risk:  Minimal: No current suicidal thoughts.  No current homicidal thoughts.  No current thoughts of violence.  Modifiable risk factor targeted during this admission as substance related mood disorder.  Patient has completely detoxed from psychoactive substances.  He is back on his psychotropic medications.  There are no new psychosocial stressors. Patient is stable for care at the lower setting.  Follow-up Information     [facility] Follow up on 02/01/2024.   Why: You have a hospital follow up appointment for therapy and medication management services on 7/11 at 9:00 am.  This will be a Virtual video appointment. Contact information: [address]  [address] [place] [address] [phone]                 Plan Of Care/Follow-up recommendations:  See discharge summary.  Jerrell DELENA Forehand, MD 01/27/2024, 11:03 AM

## 2024-01-27 NOTE — Discharge Summary (Addendum)
 Physician Discharge Summary Note  Patient:  Tyrone White is an 38 y.o., male MRN:  [id number] DOB:  05-13-1986 Patient phone:  [phone] (home)  Patient address:   [phone] [place] [address],  Total Time spent with patient: 45 minutes  Date of Admission:  01/21/2024 Date of Discharge: 01/27/2024  Reason for Admission:   CC: Drinking and depression due to homelessness x 2 years.   History of Present Illness: Tyrone White is a 38 year old African-American male with prior psychiatric history significant for Alcohol Use Disorder, Opioid Use disorder, Generalized Anxiety Disorder, Major Depressive Disorder, who presents voluntarily to [facility] from [facility] with complaints of alcohol and substance addiction with recurrent suicidal ideations. After medical evaluation/stabilization & clearance, he was transferred to the [facility] for further psychiatric evaluation & treatments.   During this evaluation, Tyrone White reports that he was admitted to a residential treatment program for substance dependence approximately 1 year ago and discharged 7 months ago at the [facility]. Initially, he did fine at discharge as he had his medications and took them consistently until he ran about 6 months ago. He reports once off of his medication he begin to initially relapse on alcohol and then began use of illicit substance percocet, cocaine, and methamphetamines and marijuana. Patient reports living with his uncle who has been concerned about his overall mental health and excessive alcohol and substance use. Patient recently had his license taken and charged with DUI after being found in a vehicle under influence of alcohol. Patient reports despite current charges,he continues to drive without a license and drive intoxicated. Patient reports he was working at a [employer] facility and thinks his position has been terminated due to missing work  today as a result of constantly being intoxicated.  Patient reports that he has been using at least 3-4 Subutex on most days when he is able to take them and to prevent withdrawal symptoms he has been buying Subutex off of the street.  He reports using 1 gm of cocaine daily and has used within the last 24 hours.  He endorses that he has had recurrent suicidal ideations without plan to end his life.    Patient endorses feeling of worthlessness, sadness, isolation, poor sleep, poor eating, with loss of 20 pounds within the last 3 months, and hopelessness pertaining to his current situation with homelessness. He denies homicidal ideations although endorses recurrent thoughts of robbing a bank to access adequate funds, however, report that this would not be a proper way to get money. Patient endorses that he has goals such as completing bachelor's degree, actively enrolled at the [employer].  He reports symptoms of anxiety to include mind racing, cannot slow down, sweaty palms, being jittery and jumpy.  He denies auditory or visual hallucination however endorses symptoms of paranoia when he is under the influence of alcohol or drugs.  He denies symptoms of PTSD, OCD, or mania.  Patient endorses that he is willing to be voluntarily admitted for treatment of addiction and underlying mental health illness. Patients demonstrates impulsivity, risk taking behaviors, and exhibits some manic behaviors, however, this may be drug induced hypomania.  We will assess patient on an on-going basis for mood disorder after clinically stable.     Objective: Patient presents alert, calm, cooperative, and oriented to person, time, place, and situation.  Chart reviewed and findings shared with the treatment team and consult with attending psychiatrist.  He  reports depressed mood with congruent affect.  Speech is clear, coherent with normal volume and pattern.  Able to participate in answering assessment questions.   Objectively not responding to internal stimuli.  He denies SI, HI, or AVH.  However endorses substance induced paranoia with feelings that people are talking about him.  Vital signs reviewed without critical values.  Labs and EKG reviewed as presented in the treatment plan.  BAL: 37, UDS: Positive for buprenorphine, methamphetamine, oxycodone, and marijuana.  Patient is admitted for safety, medication management, and stability.   Mode of transport to Hospital: Safe transport Current Outpatient (Home) Medication List: See home medication listing PRN medication prior to evaluation: See home medication listing   ED course: Labs and EKG will obtain and analyzed Collateral Information: None obtained at this time POA/Legal Guardian: Patient is his own legal guardian  Principal Problem: Mood disorder [facility]) Discharge Diagnoses: Principal Problem:   Mood disorder (HCC) Active Problems:   Alcohol-induced mood disorder (HCC)   Past Psychiatric History:  Previous Psych Diagnoses: Alcohol-induced mood disorder, 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 Prior inpatient treatment: Yes x 3, 2 times at [facility], and 1 time at [facility] Current/prior outpatient treatment: Denies Prior rehab hx: Yes at [facility] Psychotherapy hx: Yes History of suicide: Denies History of homicide or aggression: Denies Psychiatric medication history: Yes, patient has been on trial of Seroquel , BuSpar , trazodone  Psychiatric medication compliance history: Noncompliance Neuromodulation history: Denies neuromodulation Current Psychiatrist: Denies current psychiatrist Current therapist: Denies current therapist   Substance Abuse Hx: Alcohol: Endorses drinking 1 case of beer daily initiated Tobacco: Endorses smoking 3 cigarettes daily Illicit drugs: Report using methamphetamine 1 g daily, marijuana 1 blunts per day, and Buprenorphine to prevent withdrawal from  opioids Rx drug abuse: Yes Rehab hx: Yes  Past Medical History:  Past Medical History:  Diagnosis Date   Anxiety    Articular cartilage disorder of knee, 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 No pertinent surgical history. Family History:  Family History  Problem Relation Age of Onset   Heart disease Mother    Hypertension Mother    Diabetes Maternal Uncle    Prostate cancer Father    Family Psychiatric  History:  Social History:  Social History   Substance and Sexual Activity  Alcohol Use Not Currently     Social History   Substance and Sexual Activity  Drug Use Not Currently    Social History   Socioeconomic History   Marital status: Single    Spouse name: Not on file   Number of children: Not on file   Years of education: Not on file   Highest education level: Not on file  Occupational History   Not on file  Tobacco Use   Smoking status: Some Days    Types: Cigarettes   Smokeless tobacco: Never   Tobacco comments:    state that he smokes 3 cigarettes a day  Vaping Use   Vaping status: Never Used  Substance and Sexual Activity   Alcohol use: Not Currently   Drug use: Not Currently   Sexual activity: Not Currently  Other Topics Concern   Not on file  Social History Narrative   Not on file   Social Drivers of Health   Financial Resource Strain: Not on file  Food Insecurity: Food Insecurity Present (01/21/2024)   Hunger Vital Sign    Worried About Running Out  of Food in the Last Year: Sometimes true    Ran Out of Food in the Last Year: Sometimes true  Transportation Needs: No Transportation Needs (01/21/2024)   PRAPARE - Administrator, Civil Service (Medical): No    Lack of Transportation (Non-Medical): No  Physical Activity: Not on file  Stress: Not on file  Social Connections: Not on file    Hospital Course:   Patient was admitted on suicide precautions.  He was on symptomatic detox protocol from  multiple psychoactive substances.  Quetiapine  was reinstated.  Buspirone  was later added to aid with anxiety.  Patient had swollen tongue from trial of sertraline .   Patient requested as needed IM medication repeatedly.  He did not really exhibit any psychotic features on the unit.  He seems to seek out Benadryl  and lorazepam .  We will discontinue lorazepam  from his as needed regimen.  Patient did not require any medical emergency measures.  No restraints or seclusion during his hospital stay.  He was initially interested in rehab but changed his mind to sober living and finally decided to go back home.  Nursing staff reports that he has been appropriate on the unit.  No observed response to internal stimuli.  Not endorsing any self-injurious thoughts or behavior.  He slept for 8.25 hours last night.  Vital signs have been stable.  Seen today.  Patient states that he remembers being allergic to sertraline .  It made his tongue swell yesterday.  States that he feels good now he is not on the unit.  Patient states that buspirone  has been effective in controlling his anxiety.  He is not feeling depressed today.  Less rumination about recent deaths.  Patient states that he feels ready to go back home.  He is no longer interested in sober living situation.  States that he plans to follow up with addiction treatment in the community.  No evidence of depression.  No overwhelming anxiety.  No evidence of mania.  No evidence of psychosis.  No current craving for psychoactive substances.  No rageful thoughts towards self or towards others.  Patient and team agrees that he is back to his baseline.  Team agrees with discharge today.   Physical Findings: AIMS:  , ,  ,  ,  ,  ,   CIWA:  CIWA-Ar Total: 2 COWS:  COWS Total Score: 0  Musculoskeletal: Strength & Muscle Tone: within normal limits Gait & Station: normal Patient leans: N/A   Psychiatric Specialty Exam:  Presentation  General Appearance and  behavior:  Casually dressed, not in any distress, appropriate behavior, engaged politely.  No EPS.   Eye Contact: Good.   Speech: Spontaneous.  Normal rate, tone and volume.  Normal prosody of speech.   Mood and Affect  Mood: Euthymic.   Affect: Full range and appropriate.   Thought Process  Thought Processes: Linear and goal directed.   Descriptions of Associations:Intact   Orientation:Full (Time, Place and Person)   Thought Content: No current suicidal thoughts.  No homicidal thoughts.  No thoughts of violence.  No negative ruminative flooding.  No guilty ruminations.  No delusional theme.  No obsessions.   Hallucinations: No hallucination in any modality.   Sensorium  Memory: Good.   Judgment: Good.   Insight: Limited as he is not full motivated to deal with his addiction at this time.   Executive Functions  Concentration: Good.   Attention Span: Good.   Recall: Good.   Fund of Knowledge: Good.  Language: Good     Psychomotor Activity  Normal psychomotor activity   Physical Exam: Physical Exam ROS Blood pressure (!) 138/91, pulse 90, temperature 98.3 F (36.8 C), temperature source Oral, resp. rate 18, height 6' 2 (1.88 m), weight 76.2 kg, SpO2 100%. Body mass index is 21.57 kg/m.   Social History   Tobacco Use  Smoking Status Some Days   Types: Cigarettes  Smokeless Tobacco Never  Tobacco Comments   state that he smokes 3 cigarettes a day   Tobacco Cessation:  A prescription for an FDA-approved tobacco cessation medication provided at discharge   Blood Alcohol level:  Lab Results  Component Value Date   ETH 37 (H) 01/21/2024   ETH <10 08/07/2020    Metabolic Disorder Labs:  Lab Results  Component Value Date   HGBA1C 5.8 (H) 01/21/2024   MPG 119.76 01/21/2024   No results found for: PROLACTIN Lab Results  Component Value Date   CHOL 176 01/21/2024   TRIG 42 01/21/2024   HDL 92 01/21/2024   CHOLHDL 1.9  01/21/2024   VLDL 8 01/21/2024   LDLCALC 76 01/21/2024   LDLCALC 98 06/23/2020    See Psychiatric Specialty Exam and Suicide Risk Assessment completed by Attending Physician prior to discharge.  Discharge destination:  Home  Is patient on multiple antipsychotic therapies at discharge:  No   Has Patient had three or more failed trials of antipsychotic monotherapy by history:  No  Recommended Plan for Multiple Antipsychotic Therapies: NA  Discharge Instructions     Diet - low sodium heart healthy   Complete by: As directed    Increase activity slowly   Complete by: As directed       Allergies as of 01/27/2024   No Known Allergies      Medication List     STOP taking these medications    mirtazapine  30 MG tablet Commonly known as: REMERON        TAKE these medications      Indication  busPIRone  10 MG tablet Commonly known as: BUSPAR  Take 1 tablet (10 mg total) by mouth 2 (two) times daily. What changed: when to take this  Indication: Anxiety Disorder, Major Depressive Disorder   naltrexone  50 MG tablet Commonly known as: DEPADE Take 1 tablet (50 mg total) by mouth daily. Start taking on: January 28, 2024  Indication: Abuse or Misuse of Alcohol, Opioid Dependence   nicotine  21 mg/24hr patch Commonly known as: NICODERM CQ  - dosed in mg/24 hours Place 1 patch (21 mg total) onto the skin daily at 6 (six) AM. Start taking on: January 28, 2024  Indication: Nicotine  Addiction   QUEtiapine  200 MG tablet Commonly known as: SEROQUEL  Take 1 tablet (200 mg total) by mouth at bedtime. What changed:  medication strength how much to take  Indication: Generalized Anxiety Disorder, Major Depressive Disorder        Follow-up Information     [facility] Follow up on 02/01/2024.   Why: You have a hospital follow up appointment for therapy and medication management services on 7/11 at 9:00 am.  This will be a Virtual video appointment. Contact information: [address]   [address] [place] [address] [phone]                 Follow-up recommendations:   Patient will stay on medication as recommended.  Patient encouraged to follow up as recommended.  No restrictions with respect to diet or activity.  Signed: Jerrell DELENA Forehand, MD 01/27/2024,  11:07 AM

## 2024-01-27 NOTE — Group Note (Signed)
 Date:  01/27/2024 Time:  10:03 AM  Group Topic/Focus:  Goals Group:   The focus of this group is to help patients establish daily goals to achieve during treatment and discuss how the patient can incorporate goal setting into their daily lives to aide in recovery.    Participation Level:  Active  Participation Quality:  Appropriate  Affect:  Appropriate  Cognitive:  Appropriate  Insight: Appropriate  Engagement in Group:  Engaged  Modes of Intervention:  Discussion  Additional Comments:    Tyrone White Brock Larmon 01/27/2024, 10:03 AM

## 2024-01-27 NOTE — Progress Notes (Signed)
  [facility] Adult Case Management Discharge Plan :  Will you be returning to the same living situation after discharge:  Yes,    At discharge, do you have transportation home?: No. CSW to coordinate Do you have the ability to pay for your medications: Yes,  [employer] / [employer] PREFERRED  Release of information consent forms completed and in the chart;  Patient's signature needed at discharge.  Patient to Follow up at:  Follow-up Information     [facility] Follow up on 02/01/2024.   Why: You have a hospital follow up appointment for therapy and medication management services on 7/11 at 9:00 am.  This will be a Virtual video appointment. Contact information: [address]  [address] [place] [address] [phone]                 Next level of care provider has access to [facility] Link:no  Safety Planning and Suicide Prevention discussed: Hipolito Krabbe [facility]) [phone] [facility])     Has patient been referred to the Quitline?: Patient refused referral for treatment  Patient has been referred for addiction treatment: Patient refused referral for treatment.  Golda Louder, LCSWA 01/27/2024, 11:54 AM

## 2024-01-27 NOTE — Plan of Care (Signed)
 ?  Problem: Activity: ?Goal: Interest or engagement in activities will improve ?Outcome: Progressing ?Goal: Sleeping patterns will improve ?Outcome: Progressing ?  ?Problem: Coping: ?Goal: Ability to verbalize frustrations and anger appropriately will improve ?Outcome: Progressing ?Goal: Ability to demonstrate self-control will improve ?Outcome: Progressing ?  ?Problem: Safety: ?Goal: Periods of time without injury will increase ?Outcome: Progressing ?  ?

## 2024-01-27 NOTE — Progress Notes (Signed)
 Patient discharged to home via taxi. Discharge instructions, all required discharge documents and information about follow-up appointment given to pt with verbalization of understanding. All personal belongings returned to pt at time of discharge. Pt escorted to lobby by RN at 1345.  01/27/24 9147  Psych Admission Type (Psych Patients Only)  Admission Status Voluntary  Psychosocial Assessment  Patient Complaints Anxiety  Eye Contact Fair  Facial Expression Flat  Affect Appropriate to circumstance  Speech Logical/coherent  Interaction Assertive  Motor Activity Other (Comment) (wnl)  Appearance/Hygiene Unremarkable  Behavior Characteristics Calm  Mood Anxious  Thought Process  Coherency WDL  Content WDL  Delusions None reported or observed  Perception WDL  Hallucination None reported or observed  Judgment Impaired  Confusion None  Danger to Self  Current suicidal ideation? Denies  Agreement Not to Harm Self Yes  Description of Agreement Verbal  Danger to Others  Danger to Others None reported or observed

## 2024-01-27 NOTE — Progress Notes (Signed)
   01/26/24 2100  Psych Admission Type (Psych Patients Only)  Admission Status Voluntary  Psychosocial Assessment  Patient Complaints None  Eye Contact Fair  Facial Expression Flat  Affect Appropriate to circumstance  Speech Logical/coherent  Interaction Isolative  Motor Activity  (WNL)  Appearance/Hygiene Other (Comment)  Behavior Characteristics Calm  Mood Other (Comment) (Unable to assess.)  Thought Process  Coherency WDL  Content WDL  Delusions None reported or observed  Perception WDL  Hallucination None reported or observed  Judgment Limited  Confusion None  Danger to Self  Current suicidal ideation?  (Denies)  Agreement Not to Harm Self Yes  Description of Agreement Notify Staff  Danger to Others  Danger to Others None reported or observed

## 2024-01-27 NOTE — [facility] Suicide Risk Assessment (Signed)
 [facility] INPATIENT:  Family/Significant Other Suicide Prevention Education  Suicide Prevention Education:  Education Completed; Hipolito Krabbe Midwife) [phone] (Mobile) ,  (name of family member/significant other) has been identified by the patient as the family member/significant other with whom the patient will be residing, and identified as the person(s) who will aid the patient in the event of a mental health crisis (suicidal ideations/suicide attempt).  With written consent from the patient, the family member/significant other has been provided the following suicide prevention education, prior to the and/or following the discharge of the patient.  The suicide prevention education provided includes the following: Suicide risk factors Suicide prevention and interventions National Suicide Hotline telephone number [facility] assessment telephone number [facility] Emergency Assistance 911 [facility] and/or Residential Mobile Crisis Unit telephone number  Request made of family/significant other to: Remove weapons (e.g., guns, rifles, knives), all items previously/currently identified as safety concern.   Remove drugs/medications (over-the-counter, prescriptions, illicit drugs), all items previously/currently identified as a safety concern.  The family member/significant other verbalizes understanding of the suicide prevention education information provided.  The family member/significant other agrees to remove the items of safety concern listed above.  Tyrone White 01/27/2024, 11:53 AM

## 2024-01-28 ENCOUNTER — Ambulatory Visit (HOSPITAL_COMMUNITY)
Admission: EM | Admit: 2024-01-28 | Discharge: 2024-01-29 | Disposition: A | Discharge status: Other Acute Inpatient Hospital | Attending: Family Medicine | Admitting: Family Medicine

## 2024-01-28 DIAGNOSIS — F1910 Other psychoactive substance abuse, uncomplicated: Secondary | ICD-10-CM

## 2024-01-28 DIAGNOSIS — F32A Depression, unspecified: Secondary | ICD-10-CM

## 2024-01-28 DIAGNOSIS — F1994 Other psychoactive substance use, unspecified with psychoactive substance-induced mood disorder: Secondary | ICD-10-CM

## 2024-01-28 DIAGNOSIS — Z5900 Homelessness unspecified: Secondary | ICD-10-CM

## 2024-01-28 DIAGNOSIS — F1020 Alcohol dependence, uncomplicated: Secondary | ICD-10-CM

## 2024-01-28 DIAGNOSIS — F419 Anxiety disorder, unspecified: Secondary | ICD-10-CM

## 2024-01-28 DIAGNOSIS — F1914 Other psychoactive substance abuse with psychoactive substance-induced mood disorder: Secondary | ICD-10-CM | POA: Diagnosis not present

## 2024-01-28 DIAGNOSIS — F332 Major depressive disorder, recurrent severe without psychotic features: Secondary | ICD-10-CM | POA: Insufficient documentation

## 2024-01-28 DIAGNOSIS — R45851 Suicidal ideations: Secondary | ICD-10-CM | POA: Insufficient documentation

## 2024-01-28 MED ORDER — DIPHENHYDRAMINE HCL 50 MG PO CAPS: 50.0000 mg | ORAL_CAPSULE | Freq: Three times a day (TID) | ORAL | Status: DC | PRN

## 2024-01-28 MED ORDER — LORAZEPAM 2 MG/ML IJ SOLN: 2.0000 mg | Freq: Three times a day (TID) | INTRAMUSCULAR | Status: DC | PRN

## 2024-01-28 MED ORDER — BUSPIRONE HCL 10 MG PO TABS
10.0000 mg | ORAL_TABLET | Freq: Two times a day (BID) | ORAL | Status: DC
  Administered 2024-01-28: 10 mg via ORAL
  Filled 2024-01-28 (×2): qty 1

## 2024-01-28 MED ORDER — ONDANSETRON 4 MG PO TBDP: 4.0000 mg | ORAL_TABLET | Freq: Four times a day (QID) | ORAL | Status: DC | PRN

## 2024-01-28 MED ORDER — CHLORDIAZEPOXIDE HCL 25 MG PO CAPS: 25.0000 mg | ORAL_CAPSULE | Freq: Four times a day (QID) | ORAL | Status: DC | PRN

## 2024-01-28 MED ORDER — HALOPERIDOL LACTATE 5 MG/ML IJ SOLN: 5.0000 mg | Freq: Three times a day (TID) | INTRAMUSCULAR | Status: DC | PRN

## 2024-01-28 MED ORDER — QUETIAPINE FUMARATE 200 MG PO TABS
200.0000 mg | ORAL_TABLET | Freq: Every day | ORAL | Status: DC
  Administered 2024-01-28: 200 mg via ORAL
  Filled 2024-01-28: qty 1

## 2024-01-28 MED ORDER — DIPHENHYDRAMINE HCL 50 MG/ML IJ SOLN: 50.0000 mg | Freq: Three times a day (TID) | INTRAMUSCULAR | Status: DC | PRN

## 2024-01-28 MED ORDER — ALUM & MAG HYDROXIDE-SIMETH 200-200-20 MG/5ML PO SUSP: 30.0000 mL | ORAL | Status: DC | PRN

## 2024-01-28 MED ORDER — TRAZODONE HCL 50 MG PO TABS: 50.0000 mg | ORAL_TABLET | Freq: Every evening | ORAL | Status: DC | PRN

## 2024-01-28 MED ORDER — THIAMINE MONONITRATE 100 MG PO TABS
100.0000 mg | ORAL_TABLET | Freq: Every day | ORAL | Status: DC
  Administered 2024-01-29: 100 mg via ORAL
  Filled 2024-01-28: qty 1

## 2024-01-28 MED ORDER — ADULT MULTIVITAMIN W/MINERALS CH
1.0000 | ORAL_TABLET | Freq: Every day | ORAL | Status: DC
  Administered 2024-01-29: 1 via ORAL
  Filled 2024-01-28: qty 1

## 2024-01-28 MED ORDER — LOPERAMIDE HCL 2 MG PO CAPS: 2.0000 mg | ORAL_CAPSULE | ORAL | Status: DC | PRN

## 2024-01-28 MED ORDER — HALOPERIDOL 5 MG PO TABS: 5.0000 mg | ORAL_TABLET | Freq: Three times a day (TID) | ORAL | Status: DC | PRN

## 2024-01-28 MED ORDER — HYDROXYZINE HCL 25 MG PO TABS
25.0000 mg | ORAL_TABLET | Freq: Three times a day (TID) | ORAL | Status: DC | PRN
  Administered 2024-01-29: 25 mg via ORAL
  Filled 2024-01-28: qty 1

## 2024-01-28 MED ORDER — HALOPERIDOL LACTATE 5 MG/ML IJ SOLN: 10.0000 mg | Freq: Three times a day (TID) | INTRAMUSCULAR | Status: DC | PRN

## 2024-01-28 MED ORDER — HYDROXYZINE HCL 25 MG PO TABS: 25.0000 mg | ORAL_TABLET | Freq: Four times a day (QID) | ORAL | Status: DC | PRN

## 2024-01-28 MED ORDER — MAGNESIUM HYDROXIDE 400 MG/5ML PO SUSP: 30.0000 mL | Freq: Every day | ORAL | Status: DC | PRN

## 2024-01-28 MED ORDER — ACETAMINOPHEN 325 MG PO TABS: 650.0000 mg | ORAL_TABLET | Freq: Four times a day (QID) | ORAL | Status: DC | PRN

## 2024-01-28 NOTE — ED Provider Notes (Signed)
 [facility] Continuous Assessment Admission H&P  Date: 01/28/24 Patient Name: Tyrone White MRN: [id number] Chief Complaint:  I was feeling real depressed and anxious  Diagnoses:  Final diagnoses:  Anxiety and depression  Polysubstance abuse (HCC)  Homelessness unspecified    HPI: Tyrone White is a 38 year old African-American male with prior psychiatric history significant for Alcohol Use Disorder, Opioid Use disorder, Generalized Anxiety Disorder, Major Depressive Disorder, and homelessness x 2 years, who presents voluntarily to [facility] via ambulance due to anxiety/panic attack.  Patient was seen face to face by this provider and chart reviewed. Per chart review, Patient was discharged from the [facility] yesterday 01/27/24, after his admission on 01/21/24 for (Drinking and depression due to homelessness x 2 years). He had a scheduled zoom meeting with [facility] for intake today, which he did not attend.  On approach, patient presents as very inattentive with slow and slurred speech.  He reports drinking 2 beers today and smoking some weed. He denies drinking daily, but endorses drinking at least 2 beers during the weekends.  He reports he started drinking since age 70.  He reports presenting back to the [facility] because I was feeling real depressed and anxious, ongoing for 2 weeks, I was discharged yesterday from the hospital, but my mind is still messed up.  He reports being depressed because my uncle passed away and I've just been isolating, just sad, just down and tired.  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  Patient reports he was started on some new medicine, a week ago when I got into the hospital, and I was supposed to pick some of it up today but I didn't . He last took his medications yesterday. Per chart review, patient was started on buspirone  and Seroquel  while inpatient at the [facility] to manage his anxiety  and depressive symptoms.    During this evaluation, patient continues to be inattentive, appears to dose off a few times, with slurred speech.  He appears very sleepy/uninterested in the process. His eyes are closed or halfway open, with his hands inside his T-shirt constantly rubbing his upper body.  On evaluation, patient is somnolent, oriented at baseline, and minimally cooperative. Speech is slurred and slow. Pt appears casually dressed. There is no eye contact. Mood is anxious and irritable, affect is congruent with mood. Thought process is coherent. linear and thought content is WDL. Pt denies SI/HI/AVH. There is no objective indication that the patient is responding to internal stimuli. No delusions elicited during this assessment.    Total Time spent with patient: 30 minutes  Musculoskeletal  Strength & Muscle Tone: within normal limits Gait & Station: normal Patient leans: N/A  Psychiatric Specialty Exam  Presentation General Appearance:  Casual  Eye Contact: None  Speech: Slow; Slurred  Speech Volume: Normal  Handedness: Right   Mood and Affect  Mood: Irritable; Depressed; Anxious  Affect: Congruent   Thought Process  Thought Processes: Coherent; Linear  Descriptions of Associations:Loose  Orientation:Full (Time, Place and Person)  Thought Content:WDL  Diagnosis of Schizophrenia or Schizoaffective disorder in past: No   Hallucinations:Hallucinations: None  Ideas of Reference:None  Suicidal Thoughts:Suicidal Thoughts: No  Homicidal Thoughts:Homicidal Thoughts: No   Sensorium  Memory: Immediate Fair  Judgment: Poor  Insight: Shallow   Executive Functions  Concentration: Poor  Attention Span: Poor  Recall: Fair  Fund of Knowledge: Fair  Language: Fair   Psychomotor Activity  Psychomotor Activity: Psychomotor Activity:  Mannerisms   Assets  Assets: Manufacturing systems engineer; Desire for Improvement   Sleep   Sleep: Sleep: Fair   Nutritional Assessment (For OBS and FBC admissions only) Has the patient had a weight loss or gain of 10 pounds or more in the last 3 months?: No Has the patient had a decrease in food intake/or appetite?: No Does the patient have dental problems?: No Does the patient have eating habits or behaviors that may be indicators of an eating disorder including binging or inducing vomiting?: No Has the patient recently lost weight without trying?: 0 Has the patient been eating poorly because of a decreased appetite?: 0 Malnutrition Screening Tool Score: 0    Physical Exam Constitutional:      General: He is not in acute distress.    Appearance: He is not diaphoretic.  HENT:     Nose: No congestion.  Cardiovascular:     Rate and Rhythm: Normal rate.  Pulmonary:     Effort: No respiratory distress.  Chest:     Chest wall: No tenderness.  Neurological:     Mental Status: He is alert and oriented to person, place, and time.  Psychiatric:        Attention and Perception: He is inattentive.        Mood and Affect: Mood is anxious and depressed.        Speech: Speech is slurred.        Behavior: Behavior is slowed.        Thought Content: Thought content normal.    Review of Systems  Constitutional:  Negative for chills, diaphoresis and fever.  HENT:  Negative for congestion.   Eyes:  Negative for discharge.  Respiratory:  Negative for cough, shortness of breath and wheezing.   Cardiovascular:  Negative for chest pain and palpitations.  Gastrointestinal:  Negative for diarrhea and nausea.  Neurological:  Negative for dizziness, seizures, loss of consciousness and headaches.  Psychiatric/Behavioral:  Positive for depression and substance abuse. The patient is nervous/anxious.     Blood pressure 110/75, pulse 83, temperature 98.4 F (36.9 C), temperature source Oral, resp. rate 16, SpO2 96%. There is no height or weight on file to calculate BMI.  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 Prior inpatient treatment: Yes x 3, 2 times at [facility], and 1 time at [facility] Current/prior outpatient treatment: Denies Prior rehab hx: Yes at [facility] Psychotherapy hx: Yes History of suicide: Denies History of homicide or aggression: Denies Psychiatric medication history: Yes, patient has been on trial of Seroquel , BuSpar , trazodone  Psychiatric medication compliance history: Noncompliance Neuromodulation history: Denies neuromodulation Current Psychiatrist: Denies current psychiatrist Current therapist: Denies current therapist  Substance Abuse Hx: Alcohol: Endorses drinking 1 case of beer daily initiated Tobacco: Endorses smoking 3 cigarettes daily Illicit drugs: Report using methamphetamine 1 g daily, marijuana 1 blunts per day, and Buprenorphine to prevent withdrawal from opioids Rx drug abuse: Yes Rehab hx: Yes   Is the patient at risk to self? Yes  Has the patient been a risk to self in the past 6 months? Yes .    Has the patient been a risk to self within the distant past? Yes   Is the patient a risk to others? No   Has the patient been a risk to others in the past 6 months? unknown  Has the patient been a risk to others within the distant past?  unknown  Past Medical History: See Chart Family History: N/A  Social History: N/A  Last Labs:  Admission on 01/21/2024, Discharged on 01/21/2024  Component Date Value Ref Range Status   WBC 01/21/2024 4.9  4.0 - 10.5 K/uL Final   RBC 01/21/2024 4.97  4.22 - 5.81 MIL/uL Final   Hemoglobin 01/21/2024 14.9  13.0 - 17.0 g/dL Final   HCT 93/69/7974 46.2  39.0 - 52.0 % Final   MCV 01/21/2024 93.0  80.0 - 100.0 fL Final   MCH 01/21/2024 30.0  26.0 - 34.0 pg Final   MCHC 01/21/2024 32.3  30.0 - 36.0 g/dL Final   RDW 93/69/7974 12.6  11.5 - 15.5 %  Final   Platelets 01/21/2024 204  150 - 400 K/uL Final   nRBC 01/21/2024 0.0  0.0 - 0.2 % Final   Neutrophils Relative % 01/21/2024 61  % Final   Neutro Abs 01/21/2024 3.0  1.7 - 7.7 K/uL Final   Lymphocytes Relative 01/21/2024 28  % Final   Lymphs Abs 01/21/2024 1.4  0.7 - 4.0 K/uL Final   Monocytes Relative 01/21/2024 8  % Final   Monocytes Absolute 01/21/2024 0.4  0.1 - 1.0 K/uL Final   Eosinophils Relative 01/21/2024 2  % Final   Eosinophils Absolute 01/21/2024 0.1  0.0 - 0.5 K/uL Final   Basophils Relative 01/21/2024 1  % Final   Basophils Absolute 01/21/2024 0.0  0.0 - 0.1 K/uL Final   Immature Granulocytes 01/21/2024 0  % Final   Abs Immature Granulocytes 01/21/2024 0.01  0.00 - 0.07 K/uL Final   Performed at [facility], [address], [place], [place] [address]   Sodium 01/21/2024 139  135 - 145 mmol/L Final   Potassium 01/21/2024 4.4  3.5 - 5.1 mmol/L Final   Chloride 01/21/2024 98  98 - 111 mmol/L Final   CO2 01/21/2024 29  22 - 32 mmol/L Final   Glucose, Bld 01/21/2024 87  70 - 99 mg/dL Final   Glucose reference range applies only to samples taken after fasting for at least 8 hours.   BUN 01/21/2024 <5 (L)  6 - 20 mg/dL Final   Creatinine, Ser 01/21/2024 0.79  0.61 - 1.24 mg/dL Final   Calcium  01/21/2024 9.7  8.9 - 10.3 mg/dL Final   Total Protein 93/69/7974 7.6  6.5 - 8.1 g/dL Final   Albumin 93/69/7974 4.6  3.5 - 5.0 g/dL Final   AST 93/69/7974 54 (H)  15 - 41 U/L Final   ALT 01/21/2024 45 (H)  0 - 44 U/L Final   Alkaline Phosphatase 01/21/2024 75  38 - 126 U/L Final   Total Bilirubin 01/21/2024 1.0  0.0 - 1.2 mg/dL Final   GFR, Estimated 01/21/2024 >60  >60 mL/min Final   Comment: (NOTE) Calculated using the CKD-EPI Creatinine Equation (2021)    Anion gap 01/21/2024 12  5 - 15 Final   Performed at [facility], [address] [place], [place] [address]   Hgb A1c MFr Bld 01/21/2024 5.8 (H)  4.8 - 5.6 % Final   Comment: (NOTE) Diagnosis of  Diabetes The following HbA1c ranges recommended by the American Diabetes Association (ADA) may be used as an aid in the diagnosis of diabetes mellitus.  Hemoglobin             Suggested A1C NGSP%              Diagnosis  <5.7  Non Diabetic  5.7-6.4                Pre-Diabetic  >6.4                   Diabetic  <7.0                   Glycemic control for                       adults with diabetes.     Mean Plasma Glucose 01/21/2024 119.76  mg/dL Final   Performed at [facility], [address], [place], [place] [address]   Magnesium  01/21/2024 2.2  1.7 - 2.4 mg/dL Final   Performed at [facility], [address] [place], [place] [address]   Alcohol, Ethyl (B) 01/21/2024 37 (H)  <15 mg/dL Final   Comment: (NOTE) For medical purposes only. Performed at [facility], [address], [place], [place] [address]    Cholesterol 01/21/2024 176  0 - 200 mg/dL Final   Triglycerides 93/69/7974 42  <150 mg/dL Final   HDL 93/69/7974 92  >40 mg/dL Final   Total CHOL/HDL Ratio 01/21/2024 1.9  RATIO Final   VLDL 01/21/2024 8  0 - 40 mg/dL Final   LDL Cholesterol 01/21/2024 76  0 - 99 mg/dL Final   Comment:        Total Cholesterol/HDL:CHD Risk Coronary Heart Disease Risk Table                     Men   Women  1/2 Average Risk   3.4   3.3  Average Risk       5.0   4.4  2 X Average Risk   9.6   7.1  3 X Average Risk  23.4   11.0        Use the calculated Patient Ratio above and the CHD Risk Table to determine the patient's CHD Risk.        ATP III CLASSIFICATION (LDL):  <100     mg/dL   Optimal  899-870  mg/dL   Near or Above                    Optimal  130-159  mg/dL   Borderline  839-810  mg/dL   High  >809     mg/dL   Very High Performed at [facility], [address], [place], [place] [address]    TSH 01/21/2024 0.376  0.350 - 4.500 uIU/mL Final   Comment: Performed by a 3rd Generation assay with a functional sensitivity of <=0.01  uIU/mL. Performed at [facility], [address], [place], [place] [address]    POC Amphetamine UR 01/21/2024 None Detected  NONE DETECTED (Cut Off Level 1000 ng/mL) Final   POC Secobarbital (BAR) 01/21/2024 None Detected  NONE DETECTED (Cut Off Level 300 ng/mL) Final   POC Buprenorphine (BUP) 01/21/2024 Positive (A)  NONE DETECTED (Cut Off Level 10 ng/mL) Final   POC Oxazepam (BZO) 01/21/2024 None Detected  NONE DETECTED (Cut Off Level 300 ng/mL) Final   POC Cocaine UR 01/21/2024 None Detected  NONE DETECTED (Cut Off Level 300 ng/mL) Final   POC Methamphetamine UR 01/21/2024 Positive (A)  NONE DETECTED (Cut Off Level 1000 ng/mL) Final   POC Morphine 01/21/2024 None Detected  NONE DETECTED (Cut Off Level 300 ng/mL) Final   POC  Methadone UR 01/21/2024 None Detected  NONE DETECTED (Cut Off Level 300 ng/mL) Final   POC Oxycodone UR 01/21/2024 Positive (A)  NONE DETECTED (Cut Off Level 100 ng/mL) Final   POC Marijuana UR 01/21/2024 Positive (A)  NONE DETECTED (Cut Off Level 50 ng/mL) Final    Allergies: Patient has no known allergies.  Medications:  Facility Ordered Medications  Medication   acetaminophen  (TYLENOL ) tablet 650 mg   alum & mag hydroxide-simeth (MAALOX/MYLANTA) 200-200-20 MG/5ML suspension 30 mL   magnesium  hydroxide (MILK OF MAGNESIA) suspension 30 mL   haloperidol  (HALDOL ) tablet 5 mg   And   diphenhydrAMINE  (BENADRYL ) capsule 50 mg   haloperidol  lactate (HALDOL ) injection 5 mg   And   diphenhydrAMINE  (BENADRYL ) injection 50 mg   And   LORazepam  (ATIVAN ) injection 2 mg   haloperidol  lactate (HALDOL ) injection 10 mg   And   diphenhydrAMINE  (BENADRYL ) injection 50 mg   And   LORazepam  (ATIVAN ) injection 2 mg   hydrOXYzine  (ATARAX ) tablet 25 mg   traZODone  (DESYREL ) tablet 50 mg   QUEtiapine  (SEROQUEL ) tablet 200 mg   busPIRone  (BUSPAR ) tablet 10 mg   [START ON 01/29/2024] multivitamin with minerals tablet 1 tablet   chlordiazePOXIDE  (LIBRIUM ) capsule 25 mg    hydrOXYzine  (ATARAX ) tablet 25 mg   loperamide  (IMODIUM ) capsule 2-4 mg   ondansetron  (ZOFRAN -ODT) disintegrating tablet 4 mg   [START ON 01/29/2024] thiamine  (VITAMIN B1) tablet 100 mg   PTA Medications  Medication Sig   busPIRone  (BUSPAR ) 10 MG tablet Take 1 tablet (10 mg total) by mouth 2 (two) times daily.   nicotine  (NICODERM CQ  - DOSED IN MG/24 HOURS) 21 mg/24hr patch Place 1 patch (21 mg total) onto the skin daily at 6 (six) AM.   QUEtiapine  (SEROQUEL ) 200 MG tablet Take 1 tablet (200 mg total) by mouth at bedtime.   naltrexone  (DEPADE) 50 MG tablet Take 1 tablet (50 mg total) by mouth daily.      Medical Decision Making  Recommend admission to the continuous observation unit for safety monitor overnight and re-evaluate in the a.m. Patient denies SI/HI/AVH or paranoia.  He reports feeling still anxious and depressed.  Last took his medications yesterday.   He did not follow up with scheduled intake appointment with [facility] via zoon today or pick up his prescription.  He endorses alcohol and weed intake. Unsafe to discharge tonight.   Lab Orders         CBC with Differential/Platelet         Comprehensive metabolic panel         Ethanol         POCT Urine Drug Screen - (I-Screen)      Recommend prn CIWA protocol  Continue - Seroquel  200 mg p.o. daily at bedtime for GAD and MDD - BuSpar  10 mg p.o. twice daily for depressive and anxiety symptoms  Other PRNs - Tylenol , Maalox, Atarax , MOM, trazodone   -As needed agitation protocol medications  Recommendations  Based on my evaluation the patient does not appear to have an emergency medical condition.  Recommend admission to continuous observation unit for safety monitoring overnight and re-evaluate in the a.m.  Thurman LULLA Ivans, NP 01/28/24  11:22 PM
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
## 2024-01-28 NOTE — Progress Notes (Signed)
   01/28/24 2030  [facility] Triage Screening (Walk-ins at [facility] only)  What Is the Reason for Your Visit/Call Today? Pt arrived via EMS due to a recent panic/anxiety attack. Tyrone White states that he has been struggling with his anxiety and depression for the last couple of weeks following a combination of events and the passing of his uncle. He states that he had been clean from alcohol for 7 days but recently relapsed and drank 2 beers and smoked a gram or so of marijuana to try and calm himself down. He states that he recently started new medications following a 7-day stay at [facility] last week. (Buspirone , Naltrexone , Seroquel , and Quetiapine ). He endorses Si with no plan, denies HI and AVH, he endorses substance/alcohol use with 2 beers and a gram of marijuana. He is urgent.  How Long Has This Been Causing You Problems? 1 wk - 1 month  Have You Recently Had Any Thoughts About Hurting Yourself? Yes  How long ago did you have thoughts about hurting yourself? This morning  Are You Planning to Commit Suicide/Harm Yourself At This time? No  Have you Recently Had Thoughts About Hurting Someone [name]? No  Are You Planning To Harm Someone At This Time? No  Physical Abuse Denies  Verbal Abuse Denies  Sexual Abuse Denies  Exploitation of patient/patient's resources Denies  Self-Neglect Denies  Are you currently experiencing any auditory, visual or other hallucinations? No  Have You Used Any Alcohol or Drugs in the Past 24 Hours? Yes  What Did You Use and How Much? 2 Beers and a gram of marijuana  Do you have any current medical co-morbidities that require immediate attention? No  What Do You Feel Would Help You the Most Today? Treatment for Depression or other mood problem;Medication(s);Stress Management;Social Support  Determination of Need Urgent (48 hours)  Options For Referral [facility];Other: Comment;Facility-Based Crisis;Therapeutic Triage Services  Determination of Need filed? Yes

## 2024-01-29 ENCOUNTER — Other Ambulatory Visit: Payer: Self-pay

## 2024-01-29 ENCOUNTER — Encounter (HOSPITAL_COMMUNITY): Payer: Self-pay | Admitting: Emergency Medicine

## 2024-01-29 DIAGNOSIS — F1020 Alcohol dependence, uncomplicated: Secondary | ICD-10-CM | POA: Diagnosis not present

## 2024-01-29 DIAGNOSIS — F1910 Other psychoactive substance abuse, uncomplicated: Secondary | ICD-10-CM | POA: Diagnosis not present

## 2024-01-29 LAB — CBC WITH DIFFERENTIAL/PLATELET
Abs Immature Granulocytes: 0.03 K/uL (ref 0.00–0.07)
Basophils Absolute: 0 K/uL (ref 0.0–0.1)
Basophils Relative: 0 %
Eosinophils Absolute: 0.2 K/uL (ref 0.0–0.5)
Eosinophils Relative: 2 %
HCT: 43.6 % (ref 39.0–52.0)
Hemoglobin: 14.5 g/dL (ref 13.0–17.0)
Immature Granulocytes: 0 %
Lymphocytes Relative: 28 %
Lymphs Abs: 2.4 K/uL (ref 0.7–4.0)
MCH: 30.4 pg (ref 26.0–34.0)
MCHC: 33.3 g/dL (ref 30.0–36.0)
MCV: 91.4 fL (ref 80.0–100.0)
Monocytes Absolute: 0.6 K/uL (ref 0.1–1.0)
Monocytes Relative: 7 %
Neutro Abs: 5.5 K/uL (ref 1.7–7.7)
Neutrophils Relative %: 63 %
Platelets: 167 K/uL (ref 150–400)
RBC: 4.77 MIL/uL (ref 4.22–5.81)
RDW: 12.2 % (ref 11.5–15.5)
WBC: 8.7 K/uL (ref 4.0–10.5)
nRBC: 0 % (ref 0.0–0.2)

## 2024-01-29 LAB — COMPREHENSIVE METABOLIC PANEL WITH GFR
ALT: 63 U/L — ABNORMAL HIGH (ref 0–44)
AST: 43 U/L — ABNORMAL HIGH (ref 15–41)
Albumin: 4.4 g/dL (ref 3.5–5.0)
Alkaline Phosphatase: 69 U/L (ref 38–126)
Anion gap: 11 (ref 5–15)
BUN: 11 mg/dL (ref 6–20)
CO2: 23 mmol/L (ref 22–32)
Calcium: 9.9 mg/dL (ref 8.9–10.3)
Chloride: 102 mmol/L (ref 98–111)
Creatinine, Ser: 0.94 mg/dL (ref 0.61–1.24)
GFR, Estimated: >60 mL/min (ref >60)
Glucose, Bld: 117 mg/dL — ABNORMAL HIGH (ref 70–99)
Potassium: 4.2 mmol/L (ref 3.5–5.1)
Sodium: 136 mmol/L (ref 135–145)
Total Bilirubin: 0.8 mg/dL (ref 0.0–1.2)
Total Protein: 7.2 g/dL (ref 6.5–8.1)

## 2024-01-29 LAB — ETHANOL: Alcohol, Ethyl (B): <15 mg/dL (ref <15)

## 2024-01-29 NOTE — ED Notes (Signed)
 PRN atarax  given due to patient reports of anxiety rating 10/10. Medication administered with no complications. Environment secured, safety checks in place per facility policy.

## 2024-01-29 NOTE — Discharge Instructions (Addendum)
Accepted to [facility] 

## 2024-01-29 NOTE — ED Notes (Signed)
 Patient transferred to [facility]. Patient discharged in no acute distress, A& O x4 and ambulatory. Patient denied SI/HI, A/VH upon discharge. Patient verbalized understanding of admission. Patient mood fair. Patient belongings returned to patient from locker #11 complete and intact. Patient escorted to [place] via staff for transport to destination. Safety maintained.
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
## 2024-01-29 NOTE — Progress Notes (Signed)
 LCSW Progress Note  [id number]   Tyrone White  01/29/2024  9:04 AM  Description:   Inpatient Psychiatric Referral  Patient was recommended inpatient per Chinwendu Onuoha NP. There are no available beds at [facility], per [facility] Cherylynn Ernst RN. Patient was referred to the following out of network facilities:    Destination  Service Provider Address Phone Fax  [facility]  [address] [place], [place] [address] [phone]  [facility]  [address], [place] [address] [phone]  [facility]  [address] [place] [address] [phone]  [facility]  [address] [place] [address] [phone]  [facility] EFAX  [address] [place], [place] [phone]  [facility] Patient Placement  [facility], [place] [phone]      Situation ongoing, CSW to continue following and update chart as more information becomes available.    [place] Amonte Brookover, MSW, LCSW  01/29/2024 9:04 AM

## 2024-01-29 NOTE — ED Notes (Signed)
 Patient observed/assessed in bed/chair resting quietly appearing in no distress and verbalizing no complaints at this time. Will continue to monitor.

## 2024-01-29 NOTE — ED Notes (Signed)
 Pt is demanding and argumentative/   Low frustration tolerance and disrespectful.  Boundaries set.

## 2024-01-29 NOTE — ED Notes (Signed)
 Patient resting in lounger with eyes closed, respirations even and unlabored. Patient in no apparent acute distress. Environment secured. Safety checks in place per facility protocol.

## 2024-01-29 NOTE — ED Notes (Addendum)
 Patient alert & oriented x4. Denies intent to harm self or others when asked. Denies A/VH. Patient denies pain at this time, patient reporting anxiety 10/10 - patient repeatedly asking about Ativan . Patient has approached multiple staff members stating nothing else works other than Ativan  or how do I go about getting me some Ativan ?. Patient appears to medication focused at this time. Patient also reports being on a medication for alcohol detox but is unable to remember the name, reports that he had drank alcohol during his time taking this medication and that it made everything bad. No acute distress noted. Scheduled and PRN medications administered with no complications with the exception of Buspar  which the patient refused. Provider Tyrone lesches, NP made aware. Support and encouragement provided. Patient observed in milieu. Patient has been irritable, reactive to peer behaviors in milieu. Patient given PRN anxiety medication, has not had any behaviors requiring agitation medication at this time. Routine safety checks conducted per facility protocol. Encouraged patient to notify staff if any thoughts of harm towards self or others arise. Patient verbalizes understanding and agreement.

## 2024-01-29 NOTE — ED Notes (Signed)
 Patient resting in lounger. Calm, collected, no physical complaints at this time. Patient in no apparent acute distress. Environment secured. Safety checks in place per facility protocol.

## 2024-01-29 NOTE — Progress Notes (Signed)
 Pt has been accepted to [facility] TODAY 01/29/2024 Bed assignment: Main campus  Pt meets inpatient criteria per: Thurman Ivans NP  Attending Physician will be Millie Manners, MD  Report can be called to: [phone] (this is a pager, please leave call-back number when giving report)  Pt can arrive after 8 AM  Care Team Notified: Suzen Lesches NP, [place] Ward RN   [place] Jace Dowe LCSW-A   01/29/2024 9:56 AM
